# Patient Record
Sex: Male | Born: 1940 | Race: White | Hispanic: No | Marital: Married | Smoking: Former smoker
Health system: Southern US, Community
[De-identification: ages and names within clinical notes are randomized; demographics above are authoritative.]

## PROBLEM LIST (undated history)

## (undated) DIAGNOSIS — J449 Chronic obstructive pulmonary disease, unspecified: Secondary | ICD-10-CM

## (undated) DIAGNOSIS — I6529 Occlusion and stenosis of unspecified carotid artery: Secondary | ICD-10-CM

## (undated) DIAGNOSIS — N390 Urinary tract infection, site not specified: Secondary | ICD-10-CM

## (undated) DIAGNOSIS — IMO0002 Reserved for concepts with insufficient information to code with codable children: Secondary | ICD-10-CM

## (undated) DIAGNOSIS — I4891 Unspecified atrial fibrillation: Secondary | ICD-10-CM

## (undated) DIAGNOSIS — E785 Hyperlipidemia, unspecified: Secondary | ICD-10-CM

## (undated) DIAGNOSIS — N319 Neuromuscular dysfunction of bladder, unspecified: Secondary | ICD-10-CM

## (undated) HISTORY — PX: ORCHIECTOMY: SHX2116

## (undated) HISTORY — DX: Urinary tract infection, site not specified: N39.0

## (undated) HISTORY — DX: Chronic obstructive pulmonary disease, unspecified: J44.9

## (undated) HISTORY — DX: Unspecified atrial fibrillation: I48.91

## (undated) HISTORY — PX: CERVICAL SPINE SURGERY: SHX589

## (undated) HISTORY — PX: ABDOMINAL AORTIC ANEURYSM REPAIR: SUR1152

## (undated) HISTORY — DX: Neuromuscular dysfunction of bladder, unspecified: N31.9

## (undated) HISTORY — PX: SPINAL CORD DECOMPRESSION: SHX97

## (undated) HISTORY — PX: BACK SURGERY: SHX140

## (undated) HISTORY — PX: EYE SURGERY: SHX253

## (undated) HISTORY — DX: Occlusion and stenosis of unspecified carotid artery: I65.29

## (undated) HISTORY — DX: Reserved for concepts with insufficient information to code with codable children: IMO0002

## (undated) HISTORY — PX: CERVICAL DISCECTOMY: SHX98

## (undated) HISTORY — DX: Hyperlipidemia, unspecified: E78.5

---

## 1990-04-16 HISTORY — PX: CARDIAC CATHETERIZATION: SHX172

## 1996-04-16 HISTORY — PX: X-STOP IMPLANTATION: SHX2677

## 1997-08-30 ENCOUNTER — Inpatient Hospital Stay (HOSPITAL_COMMUNITY): Admission: EM | Admit: 1997-08-30 | Discharge: 1997-09-03 | Payer: Self-pay | Admitting: Emergency Medicine

## 1999-05-03 ENCOUNTER — Encounter: Admission: RE | Admit: 1999-05-03 | Discharge: 1999-05-03 | Payer: Self-pay | Admitting: Family Medicine

## 1999-05-03 ENCOUNTER — Encounter: Payer: Self-pay | Admitting: Family Medicine

## 1999-05-25 ENCOUNTER — Observation Stay (HOSPITAL_COMMUNITY): Admission: EM | Admit: 1999-05-25 | Discharge: 1999-05-26 | Payer: Self-pay | Admitting: Emergency Medicine

## 1999-05-25 ENCOUNTER — Encounter: Payer: Self-pay | Admitting: *Deleted

## 2000-07-17 ENCOUNTER — Emergency Department (HOSPITAL_COMMUNITY): Admission: EM | Admit: 2000-07-17 | Discharge: 2000-07-17 | Payer: Self-pay | Admitting: Emergency Medicine

## 2000-07-17 ENCOUNTER — Encounter: Payer: Self-pay | Admitting: Emergency Medicine

## 2001-02-10 ENCOUNTER — Ambulatory Visit (HOSPITAL_COMMUNITY): Admission: RE | Admit: 2001-02-10 | Discharge: 2001-02-10 | Payer: Self-pay | Admitting: Gastroenterology

## 2001-05-15 ENCOUNTER — Encounter: Payer: Self-pay | Admitting: Family Medicine

## 2001-05-15 ENCOUNTER — Encounter: Admission: RE | Admit: 2001-05-15 | Discharge: 2001-05-15 | Payer: Self-pay | Admitting: Family Medicine

## 2001-05-27 ENCOUNTER — Encounter: Payer: Self-pay | Admitting: Family Medicine

## 2001-05-27 ENCOUNTER — Encounter: Admission: RE | Admit: 2001-05-27 | Discharge: 2001-05-27 | Payer: Self-pay | Admitting: Family Medicine

## 2002-10-01 ENCOUNTER — Encounter: Admission: RE | Admit: 2002-10-01 | Discharge: 2002-10-01 | Payer: Self-pay | Admitting: Psychiatry

## 2002-10-01 ENCOUNTER — Encounter: Payer: Self-pay | Admitting: Psychiatry

## 2002-10-12 ENCOUNTER — Inpatient Hospital Stay (HOSPITAL_COMMUNITY): Admission: AD | Admit: 2002-10-12 | Discharge: 2002-11-02 | Payer: Self-pay | Admitting: Neurosurgery

## 2002-10-12 ENCOUNTER — Encounter: Payer: Self-pay | Admitting: Neurosurgery

## 2002-10-13 ENCOUNTER — Encounter: Payer: Self-pay | Admitting: Neurosurgery

## 2002-10-23 ENCOUNTER — Encounter: Payer: Self-pay | Admitting: Cardiovascular Disease

## 2002-10-27 ENCOUNTER — Encounter: Payer: Self-pay | Admitting: Neurosurgery

## 2002-11-02 ENCOUNTER — Inpatient Hospital Stay (HOSPITAL_COMMUNITY)
Admission: RE | Admit: 2002-11-02 | Discharge: 2002-12-02 | Payer: Self-pay | Admitting: Physical Medicine & Rehabilitation

## 2002-11-04 ENCOUNTER — Encounter: Payer: Self-pay | Admitting: Physical Medicine & Rehabilitation

## 2002-11-17 ENCOUNTER — Encounter: Payer: Self-pay | Admitting: Neurosurgery

## 2002-11-19 ENCOUNTER — Encounter: Payer: Self-pay | Admitting: Physical Medicine & Rehabilitation

## 2003-01-12 ENCOUNTER — Encounter
Admission: RE | Admit: 2003-01-12 | Discharge: 2003-04-12 | Payer: Self-pay | Admitting: Physical Medicine & Rehabilitation

## 2003-03-17 ENCOUNTER — Encounter: Admission: RE | Admit: 2003-03-17 | Discharge: 2003-03-17 | Payer: Self-pay | Admitting: Neurosurgery

## 2003-04-27 ENCOUNTER — Inpatient Hospital Stay (HOSPITAL_COMMUNITY): Admission: RE | Admit: 2003-04-27 | Discharge: 2003-04-29 | Payer: Self-pay | Admitting: Neurosurgery

## 2004-03-10 ENCOUNTER — Ambulatory Visit: Payer: Self-pay | Admitting: Family Medicine

## 2004-03-30 ENCOUNTER — Ambulatory Visit: Payer: Self-pay | Admitting: Family Medicine

## 2004-07-18 ENCOUNTER — Ambulatory Visit: Payer: Self-pay | Admitting: Family Medicine

## 2004-07-18 ENCOUNTER — Ambulatory Visit: Payer: Self-pay | Admitting: Cardiology

## 2004-08-08 ENCOUNTER — Ambulatory Visit: Payer: Self-pay

## 2004-09-07 ENCOUNTER — Ambulatory Visit: Payer: Self-pay | Admitting: Cardiology

## 2005-03-21 ENCOUNTER — Ambulatory Visit: Payer: Self-pay | Admitting: Family Medicine

## 2005-05-25 ENCOUNTER — Ambulatory Visit: Payer: Self-pay | Admitting: Family Medicine

## 2005-05-25 LAB — CONVERTED CEMR LAB: PSA: 0.33 ng/mL

## 2005-07-31 ENCOUNTER — Ambulatory Visit: Payer: Self-pay | Admitting: Cardiology

## 2006-06-10 ENCOUNTER — Ambulatory Visit: Payer: Self-pay | Admitting: Family Medicine

## 2006-06-10 LAB — CONVERTED CEMR LAB
ALT: 17 units/L (ref 0–40)
AST: 18 units/L (ref 0–37)
Albumin: 3.4 g/dL — ABNORMAL LOW (ref 3.5–5.2)
BUN: 19 mg/dL (ref 6–23)
Basophils Absolute: 0.1 10*3/uL (ref 0.0–0.1)
Basophils Relative: 0.9 % (ref 0.0–1.0)
CO2: 26 meq/L (ref 19–32)
Calcium: 9.2 mg/dL (ref 8.4–10.5)
Chloride: 105 meq/L (ref 96–112)
Cholesterol: 117 mg/dL (ref 0–200)
Creatinine, Ser: 0.6 mg/dL (ref 0.4–1.5)
Eosinophils Absolute: 0.1 10*3/uL (ref 0.0–0.6)
Eosinophils Relative: 2.2 % (ref 0.0–5.0)
GFR calc Af Amer: 173 mL/min
GFR calc non Af Amer: 143 mL/min
Glucose, Bld: 175 mg/dL — ABNORMAL HIGH (ref 70–99)
HCT: 40.4 % (ref 39.0–52.0)
HDL: 36 mg/dL — ABNORMAL LOW (ref >39.0)
Hemoglobin: 14.2 g/dL (ref 13.0–17.0)
Hgb A1c MFr Bld: 7.5 % — ABNORMAL HIGH (ref 4.6–6.0)
LDL Cholesterol: 49 mg/dL (ref 0–99)
Lymphocytes Relative: 24.9 % (ref 12.0–46.0)
MCHC: 35.1 g/dL (ref 30.0–36.0)
MCV: 96.8 fL (ref 78.0–100.0)
Monocytes Absolute: 0.4 10*3/uL (ref 0.2–0.7)
Monocytes Relative: 6.9 % (ref 3.0–11.0)
Neutro Abs: 3.9 10*3/uL (ref 1.4–7.7)
Neutrophils Relative %: 65.1 % (ref 43.0–77.0)
Phosphorus: 3 mg/dL (ref 2.3–4.6)
Platelets: 288 10*3/uL (ref 150–400)
Potassium: 4.4 meq/L (ref 3.5–5.1)
RBC: 4.17 M/uL — ABNORMAL LOW (ref 4.22–5.81)
RDW: 12 % (ref 11.5–14.6)
Sodium: 140 meq/L (ref 135–145)
Total CHOL/HDL Ratio: 3.3
Triglycerides: 158 mg/dL — ABNORMAL HIGH (ref 0–149)
VLDL: 32 mg/dL (ref 0–40)
WBC: 6 10*3/uL (ref 4.5–10.5)

## 2006-09-17 ENCOUNTER — Ambulatory Visit: Payer: Self-pay | Admitting: Cardiology

## 2006-11-28 ENCOUNTER — Encounter: Payer: Self-pay | Admitting: Family Medicine

## 2006-11-28 DIAGNOSIS — J449 Chronic obstructive pulmonary disease, unspecified: Secondary | ICD-10-CM

## 2006-11-28 DIAGNOSIS — R809 Proteinuria, unspecified: Secondary | ICD-10-CM | POA: Insufficient documentation

## 2006-11-28 DIAGNOSIS — IMO0002 Reserved for concepts with insufficient information to code with codable children: Secondary | ICD-10-CM | POA: Insufficient documentation

## 2006-11-28 DIAGNOSIS — R339 Retention of urine, unspecified: Secondary | ICD-10-CM | POA: Insufficient documentation

## 2006-11-28 DIAGNOSIS — E785 Hyperlipidemia, unspecified: Secondary | ICD-10-CM

## 2006-11-28 DIAGNOSIS — E119 Type 2 diabetes mellitus without complications: Secondary | ICD-10-CM

## 2006-11-28 DIAGNOSIS — J4489 Other specified chronic obstructive pulmonary disease: Secondary | ICD-10-CM | POA: Insufficient documentation

## 2006-11-28 DIAGNOSIS — Z87891 Personal history of nicotine dependence: Secondary | ICD-10-CM | POA: Insufficient documentation

## 2006-11-28 DIAGNOSIS — I4891 Unspecified atrial fibrillation: Secondary | ICD-10-CM | POA: Insufficient documentation

## 2006-11-29 ENCOUNTER — Ambulatory Visit: Payer: Self-pay | Admitting: Family Medicine

## 2006-12-04 ENCOUNTER — Ambulatory Visit: Payer: Self-pay | Admitting: Ophthalmology

## 2006-12-25 ENCOUNTER — Telehealth (INDEPENDENT_AMBULATORY_CARE_PROVIDER_SITE_OTHER): Payer: Self-pay | Admitting: *Deleted

## 2006-12-25 ENCOUNTER — Ambulatory Visit: Payer: Self-pay | Admitting: Family Medicine

## 2006-12-26 LAB — CONVERTED CEMR LAB
ALT: 14 units/L (ref 0–53)
AST: 14 units/L (ref 0–37)
Albumin: 3.4 g/dL — ABNORMAL LOW (ref 3.5–5.2)
BUN: 11 mg/dL (ref 6–23)
CO2: 29 meq/L (ref 19–32)
Calcium: 9.1 mg/dL (ref 8.4–10.5)
Chloride: 102 meq/L (ref 96–112)
Cholesterol: 102 mg/dL (ref 0–200)
Creatinine, Ser: 0.6 mg/dL (ref 0.4–1.5)
GFR calc Af Amer: 173 mL/min
GFR calc non Af Amer: 143 mL/min
Glucose, Bld: 125 mg/dL — ABNORMAL HIGH (ref 70–99)
HDL: 31.4 mg/dL — ABNORMAL LOW (ref >39.0)
Hgb A1c MFr Bld: 6.7 % — ABNORMAL HIGH (ref 4.6–6.0)
LDL Cholesterol: 50 mg/dL (ref 0–99)
Phosphorus: 3 mg/dL (ref 2.3–4.6)
Potassium: 4.8 meq/L (ref 3.5–5.1)
Sodium: 137 meq/L (ref 135–145)
Total CHOL/HDL Ratio: 3.2
Triglycerides: 102 mg/dL (ref 0–149)
VLDL: 20 mg/dL (ref 0–40)

## 2007-04-25 ENCOUNTER — Encounter: Payer: Self-pay | Admitting: Family Medicine

## 2007-05-01 ENCOUNTER — Telehealth: Payer: Self-pay | Admitting: Family Medicine

## 2007-07-08 ENCOUNTER — Telehealth: Payer: Self-pay | Admitting: Family Medicine

## 2007-10-20 ENCOUNTER — Encounter: Payer: Self-pay | Admitting: Family Medicine

## 2007-10-22 ENCOUNTER — Ambulatory Visit: Payer: Self-pay

## 2007-10-22 ENCOUNTER — Ambulatory Visit: Payer: Self-pay | Admitting: Family Medicine

## 2007-10-22 ENCOUNTER — Ambulatory Visit: Payer: Self-pay | Admitting: Cardiology

## 2007-12-29 ENCOUNTER — Ambulatory Visit: Payer: Self-pay | Admitting: Family Medicine

## 2008-02-09 ENCOUNTER — Ambulatory Visit: Payer: Self-pay | Admitting: Family Medicine

## 2008-02-10 LAB — CONVERTED CEMR LAB
Albumin: 3.7 g/dL (ref 3.5–5.2)
BUN: 23 mg/dL (ref 6–23)
CO2: 26 meq/L (ref 19–32)
Calcium: 8.8 mg/dL (ref 8.4–10.5)
Chloride: 108 meq/L (ref 96–112)
Creatinine, Ser: 0.8 mg/dL (ref 0.4–1.5)
GFR calc Af Amer: 124 mL/min
Glucose, Bld: 146 mg/dL — ABNORMAL HIGH (ref 70–99)
Hemoglobin: 12.7 g/dL — ABNORMAL LOW (ref 13.0–17.0)
Hgb A1c MFr Bld: 6.9 % — ABNORMAL HIGH (ref 4.6–6.0)
Phosphorus: 2.6 mg/dL (ref 2.3–4.6)
Potassium: 4.3 meq/L (ref 3.5–5.1)

## 2008-04-21 ENCOUNTER — Encounter: Payer: Self-pay | Admitting: Family Medicine

## 2008-06-28 ENCOUNTER — Ambulatory Visit: Payer: Self-pay | Admitting: Family Medicine

## 2008-06-29 LAB — CONVERTED CEMR LAB
AST: 16 units/L (ref 0–37)
Albumin: 3.9 g/dL (ref 3.5–5.2)
Alkaline Phosphatase: 52 units/L (ref 39–117)
Bilirubin, Direct: 0.1 mg/dL (ref 0.0–0.3)
CO2: 28 meq/L (ref 19–32)
Chloride: 105 meq/L (ref 96–112)
GFR calc Af Amer: 96 mL/min
GFR calc non Af Amer: 79 mL/min
Glucose, Bld: 155 mg/dL — ABNORMAL HIGH (ref 70–99)
HDL: 37.7 mg/dL — ABNORMAL LOW (ref >39.0)
Hgb A1c MFr Bld: 7.6 % — ABNORMAL HIGH (ref 4.6–6.0)
LDL Cholesterol: 60 mg/dL (ref 0–99)
Phosphorus: 3 mg/dL (ref 2.3–4.6)
Potassium: 4.6 meq/L (ref 3.5–5.1)
Sodium: 141 meq/L (ref 135–145)
Total Bilirubin: 0.9 mg/dL (ref 0.3–1.2)
Total CHOL/HDL Ratio: 3.5
Total Protein: 6.6 g/dL (ref 6.0–8.3)
VLDL: 33 mg/dL (ref 0–40)

## 2008-09-29 ENCOUNTER — Telehealth (INDEPENDENT_AMBULATORY_CARE_PROVIDER_SITE_OTHER): Payer: Self-pay | Admitting: *Deleted

## 2008-10-05 ENCOUNTER — Ambulatory Visit: Payer: Self-pay | Admitting: Family Medicine

## 2008-10-07 LAB — CONVERTED CEMR LAB
Albumin: 3.7 g/dL (ref 3.5–5.2)
CO2: 27 meq/L (ref 19–32)
Calcium: 9.2 mg/dL (ref 8.4–10.5)
Chloride: 104 meq/L (ref 96–112)
Creatinine, Ser: 0.8 mg/dL (ref 0.4–1.5)
Hgb A1c MFr Bld: 7.7 % — ABNORMAL HIGH (ref 4.6–6.5)
Phosphorus: 3.4 mg/dL (ref 2.3–4.6)
Potassium: 4.4 meq/L (ref 3.5–5.1)
Sodium: 140 meq/L (ref 135–145)

## 2008-11-05 ENCOUNTER — Encounter: Payer: Self-pay | Admitting: Family Medicine

## 2008-11-30 ENCOUNTER — Ambulatory Visit: Payer: Self-pay

## 2008-11-30 ENCOUNTER — Encounter: Payer: Self-pay | Admitting: Cardiology

## 2008-11-30 DIAGNOSIS — I6529 Occlusion and stenosis of unspecified carotid artery: Secondary | ICD-10-CM

## 2008-12-14 ENCOUNTER — Ambulatory Visit: Payer: Self-pay | Admitting: Cardiology

## 2008-12-14 DIAGNOSIS — I452 Bifascicular block: Secondary | ICD-10-CM | POA: Insufficient documentation

## 2008-12-14 DIAGNOSIS — I4892 Unspecified atrial flutter: Secondary | ICD-10-CM | POA: Insufficient documentation

## 2008-12-30 ENCOUNTER — Ambulatory Visit: Payer: Self-pay | Admitting: Family Medicine

## 2009-03-28 ENCOUNTER — Telehealth: Payer: Self-pay | Admitting: Family Medicine

## 2009-04-06 ENCOUNTER — Ambulatory Visit: Payer: Self-pay | Admitting: Family Medicine

## 2009-04-09 LAB — CONVERTED CEMR LAB
Creatinine,U: 40.2 mg/dL
Microalb Creat Ratio: 5 mg/g (ref 0.0–30.0)
Microalb, Ur: 0.2 mg/dL (ref 0.0–1.9)

## 2009-04-11 LAB — CONVERTED CEMR LAB
ALT: 17 units/L (ref 0–53)
AST: 12 units/L (ref 0–37)
Albumin: 3.7 g/dL (ref 3.5–5.2)
BUN: 29 mg/dL — ABNORMAL HIGH (ref 6–23)
CO2: 26 meq/L (ref 19–32)
Calcium: 9.1 mg/dL (ref 8.4–10.5)
Chloride: 103 meq/L (ref 96–112)
Cholesterol: 125 mg/dL (ref 0–200)
Creatinine, Ser: 1 mg/dL (ref 0.4–1.5)
GFR calc non Af Amer: 78.64 mL/min (ref >60)
Glucose, Bld: 188 mg/dL — ABNORMAL HIGH (ref 70–99)
HDL: 37.8 mg/dL — ABNORMAL LOW (ref >39.00)
LDL Cholesterol: 51 mg/dL (ref 0–99)
Potassium: 4.5 meq/L (ref 3.5–5.1)
Sodium: 136 meq/L (ref 135–145)
Total CHOL/HDL Ratio: 3
VLDL: 35.8 mg/dL (ref 0.0–40.0)

## 2009-04-25 ENCOUNTER — Ambulatory Visit: Payer: Self-pay | Admitting: Family Medicine

## 2009-07-08 ENCOUNTER — Encounter: Payer: Self-pay | Admitting: Family Medicine

## 2009-07-18 ENCOUNTER — Ambulatory Visit: Payer: Self-pay | Admitting: Family Medicine

## 2009-07-18 LAB — CONVERTED CEMR LAB
Albumin: 4 g/dL (ref 3.5–5.2)
BUN: 22 mg/dL (ref 6–23)
CO2: 27 meq/L (ref 19–32)
Calcium: 9.3 mg/dL (ref 8.4–10.5)
Chloride: 102 meq/L (ref 96–112)
Creatinine, Ser: 0.9 mg/dL (ref 0.4–1.5)
GFR calc non Af Amer: 88.74 mL/min (ref >60)
Glucose, Bld: 222 mg/dL — ABNORMAL HIGH (ref 70–99)
Hgb A1c MFr Bld: 8 % — ABNORMAL HIGH (ref 4.6–6.5)
Phosphorus: 3 mg/dL (ref 2.3–4.6)
Potassium: 4.2 meq/L (ref 3.5–5.1)
Sodium: 139 meq/L (ref 135–145)

## 2009-07-26 ENCOUNTER — Ambulatory Visit: Payer: Self-pay | Admitting: Family Medicine

## 2009-07-26 DIAGNOSIS — N390 Urinary tract infection, site not specified: Secondary | ICD-10-CM | POA: Insufficient documentation

## 2009-08-11 ENCOUNTER — Encounter: Payer: Self-pay | Admitting: Family Medicine

## 2009-08-16 ENCOUNTER — Encounter: Payer: Self-pay | Admitting: Family Medicine

## 2009-08-16 ENCOUNTER — Telehealth: Payer: Self-pay | Admitting: Family Medicine

## 2009-08-18 ENCOUNTER — Telehealth: Payer: Self-pay | Admitting: Family Medicine

## 2009-09-02 ENCOUNTER — Encounter: Payer: Self-pay | Admitting: Family Medicine

## 2009-09-09 ENCOUNTER — Encounter: Payer: Self-pay | Admitting: Family Medicine

## 2009-10-18 ENCOUNTER — Ambulatory Visit: Payer: Self-pay | Admitting: Family Medicine

## 2009-10-18 LAB — CONVERTED CEMR LAB
Albumin: 3.8 g/dL (ref 3.5–5.2)
BUN: 23 mg/dL (ref 6–23)
CO2: 28 meq/L (ref 19–32)
Calcium: 8.9 mg/dL (ref 8.4–10.5)
Chloride: 108 meq/L (ref 96–112)
Creatinine, Ser: 0.8 mg/dL (ref 0.4–1.5)
GFR calc non Af Amer: 97.36 mL/min (ref >60)
Glucose, Bld: 80 mg/dL (ref 70–99)
Hgb A1c MFr Bld: 7.3 % — ABNORMAL HIGH (ref 4.6–6.5)
Phosphorus: 3.4 mg/dL (ref 2.3–4.6)
Potassium: 4.7 meq/L (ref 3.5–5.1)
Sodium: 142 meq/L (ref 135–145)

## 2009-10-25 ENCOUNTER — Ambulatory Visit: Payer: Self-pay | Admitting: Family Medicine

## 2009-12-23 ENCOUNTER — Telehealth: Payer: Self-pay | Admitting: Family Medicine

## 2009-12-25 ENCOUNTER — Encounter: Payer: Self-pay | Admitting: Internal Medicine

## 2009-12-25 ENCOUNTER — Inpatient Hospital Stay: Payer: BC Managed Care – PPO | Admitting: Internal Medicine

## 2009-12-25 ENCOUNTER — Ambulatory Visit: Payer: Self-pay | Admitting: Cardiovascular Disease

## 2009-12-26 ENCOUNTER — Encounter: Payer: Self-pay | Admitting: Internal Medicine

## 2009-12-27 ENCOUNTER — Encounter: Payer: Self-pay | Admitting: Internal Medicine

## 2009-12-28 ENCOUNTER — Encounter: Payer: Self-pay | Admitting: Family Medicine

## 2009-12-28 ENCOUNTER — Encounter: Payer: Self-pay | Admitting: Internal Medicine

## 2010-01-09 ENCOUNTER — Ambulatory Visit: Payer: Self-pay | Admitting: Internal Medicine

## 2010-01-13 ENCOUNTER — Ambulatory Visit: Payer: Self-pay | Admitting: Internal Medicine

## 2010-01-13 DIAGNOSIS — I5022 Chronic systolic (congestive) heart failure: Secondary | ICD-10-CM

## 2010-01-16 ENCOUNTER — Telehealth (INDEPENDENT_AMBULATORY_CARE_PROVIDER_SITE_OTHER): Payer: Self-pay | Admitting: *Deleted

## 2010-01-23 ENCOUNTER — Ambulatory Visit: Payer: Self-pay | Admitting: Family Medicine

## 2010-01-23 LAB — CONVERTED CEMR LAB
ALT: 13 units/L (ref 0–53)
AST: 12 units/L (ref 0–37)
Albumin: 3.4 g/dL — ABNORMAL LOW (ref 3.5–5.2)
BUN: 16 mg/dL (ref 6–23)
CO2: 24 meq/L (ref 19–32)
Calcium: 8.5 mg/dL (ref 8.4–10.5)
Chloride: 108 meq/L (ref 96–112)
Cholesterol: 108 mg/dL (ref 0–200)
Creatinine, Ser: 0.9 mg/dL (ref 0.4–1.5)
GFR calc non Af Amer: 84.27 mL/min (ref >60)
Glucose, Bld: 170 mg/dL — ABNORMAL HIGH (ref 70–99)
HDL: 30.2 mg/dL — ABNORMAL LOW (ref >39.00)
Hgb A1c MFr Bld: 7.7 % — ABNORMAL HIGH (ref 4.6–6.5)
LDL Cholesterol: 40 mg/dL (ref 0–99)
Phosphorus: 2.8 mg/dL (ref 2.3–4.6)
Potassium: 4 meq/L (ref 3.5–5.1)
Sodium: 140 meq/L (ref 135–145)
Total CHOL/HDL Ratio: 4
Triglycerides: 191 mg/dL — ABNORMAL HIGH (ref 0.0–149.0)
VLDL: 38.2 mg/dL (ref 0.0–40.0)

## 2010-01-26 ENCOUNTER — Ambulatory Visit: Payer: Self-pay | Admitting: Family Medicine

## 2010-01-27 LAB — CONVERTED CEMR LAB
Creatinine,U: 32.2 mg/dL
Microalb Creat Ratio: 1.9 mg/g (ref 0.0–30.0)

## 2010-02-22 ENCOUNTER — Telehealth: Payer: Self-pay | Admitting: Family Medicine

## 2010-03-01 ENCOUNTER — Telehealth: Payer: Self-pay | Admitting: Family Medicine

## 2010-03-17 ENCOUNTER — Ambulatory Visit: Payer: Self-pay | Admitting: Internal Medicine

## 2010-04-26 ENCOUNTER — Other Ambulatory Visit: Payer: Self-pay | Admitting: Family Medicine

## 2010-04-26 ENCOUNTER — Ambulatory Visit
Admission: RE | Admit: 2010-04-26 | Discharge: 2010-04-26 | Payer: Self-pay | Source: Home / Self Care | Attending: Family Medicine | Admitting: Family Medicine

## 2010-04-26 ENCOUNTER — Telehealth (INDEPENDENT_AMBULATORY_CARE_PROVIDER_SITE_OTHER): Payer: Self-pay | Admitting: *Deleted

## 2010-04-26 LAB — RENAL FUNCTION PANEL
Albumin: 3.6 g/dL (ref 3.5–5.2)
BUN: 25 mg/dL — ABNORMAL HIGH (ref 6–23)
CO2: 25 mEq/L (ref 19–32)
Calcium: 8.9 mg/dL (ref 8.4–10.5)
Chloride: 107 mEq/L (ref 96–112)
Creatinine, Ser: 1 mg/dL (ref 0.4–1.5)
GFR: 81.21 mL/min (ref >60.00)
Glucose, Bld: 88 mg/dL (ref 70–99)
Phosphorus: 2.9 mg/dL (ref 2.3–4.6)
Potassium: 4.6 mEq/L (ref 3.5–5.1)
Sodium: 141 mEq/L (ref 135–145)

## 2010-04-26 LAB — HEMOGLOBIN A1C: Hgb A1c MFr Bld: 7.4 % — ABNORMAL HIGH (ref 4.6–6.5)

## 2010-05-01 ENCOUNTER — Ambulatory Visit
Admission: RE | Admit: 2010-05-01 | Discharge: 2010-05-01 | Payer: Self-pay | Source: Home / Self Care | Attending: Family Medicine | Admitting: Family Medicine

## 2010-05-14 LAB — CONVERTED CEMR LAB
ALT: 12 units/L (ref 0–53)
AST: 12 units/L (ref 0–37)
Albumin: 3.6 g/dL (ref 3.5–5.2)
Alkaline Phosphatase: 61 units/L (ref 39–117)
BUN: 21 mg/dL (ref 6–23)
Basophils Absolute: 0.1 10*3/uL (ref 0.0–0.1)
Basophils Relative: 1.3 % — ABNORMAL HIGH (ref 0.0–1.0)
Bilirubin, Direct: 0.1 mg/dL (ref 0.0–0.3)
CO2: 25 meq/L (ref 19–32)
Calcium: 8.9 mg/dL (ref 8.4–10.5)
Chloride: 105 meq/L (ref 96–112)
Cholesterol: 125 mg/dL (ref 0–200)
Creatinine, Ser: 0.8 mg/dL (ref 0.4–1.5)
Eosinophils Absolute: 0.1 10*3/uL (ref 0.0–0.7)
Eosinophils Relative: 2.7 % (ref 0.0–5.0)
GFR calc Af Amer: 124 mL/min
GFR calc non Af Amer: 102 mL/min
Glucose, Bld: 109 mg/dL — ABNORMAL HIGH (ref 70–99)
HCT: 35.9 % — ABNORMAL LOW (ref 39.0–52.0)
HDL: 40.6 mg/dL (ref >39.0)
Hemoglobin: 12.3 g/dL — ABNORMAL LOW (ref 13.0–17.0)
Hgb A1c MFr Bld: 7.9 % — ABNORMAL HIGH (ref 4.6–6.0)
LDL Cholesterol: 60 mg/dL (ref 0–99)
Lymphocytes Relative: 20.2 % (ref 12.0–46.0)
MCHC: 34.2 g/dL (ref 30.0–36.0)
MCV: 97 fL (ref 78.0–100.0)
Monocytes Absolute: 0.4 10*3/uL (ref 0.1–1.0)
Monocytes Relative: 8.4 % (ref 3.0–12.0)
Neutro Abs: 3.2 10*3/uL (ref 1.4–7.7)
Neutrophils Relative %: 67.4 % (ref 43.0–77.0)
Phosphorus: 3 mg/dL (ref 2.3–4.6)
Platelets: 261 10*3/uL (ref 150–400)
Potassium: 4.3 meq/L (ref 3.5–5.1)
RBC: 3.71 M/uL — ABNORMAL LOW (ref 4.22–5.81)
RDW: 12.6 % (ref 11.5–14.6)
Sodium: 138 meq/L (ref 135–145)
Total Bilirubin: 0.8 mg/dL (ref 0.3–1.2)
Total CHOL/HDL Ratio: 3.1
Total Protein: 6.5 g/dL (ref 6.0–8.3)
Triglycerides: 124 mg/dL (ref 0–149)
VLDL: 25 mg/dL (ref 0–40)
WBC: 4.7 10*3/uL (ref 4.5–10.5)

## 2010-05-18 NOTE — Medication Information (Signed)
Summary: [facility] Med List  [facility]   Imported By: Harlon Flor 01/02/2010 09:02:31  _____________________________________________________________________  External Attachment:    Type:   Image     Comment:   External Document

## 2010-05-18 NOTE — Letter (Signed)
[facility]   [facility]   Imported By: Roderic Ovens 01/25/2010 10:37:17  _____________________________________________________________________  External Attachment:    Type:   Image     Comment:   External Document

## 2010-05-18 NOTE — Letter (Signed)
Summary: [facility]  [facility]   Imported By: Lanelle Bal 09/20/2009 10:24:33  _____________________________________________________________________  External Attachment:    Type:   Image     Comment:   External Document

## 2010-05-18 NOTE — Assessment & Plan Note (Signed)
Summary: 3 MONTH FOLLOW UP/[facility]   Vital Signs:  Patient profile:   70 year old male Temp:     98.1 degrees F oral Pulse rate:   60 / minute Pulse rhythm:   regular BP sitting:   108 / 60  (left arm) Cuff size:   large  Vitals Entered By: Selena Batten Dance CMA Duncan Dull) (May 01, 2010 4:10 PM) CC: 3 month follow up Comments Unable to stand to weigh   History of Present Illness: here to f/u for DM2  is feeling "not bad" overall   ate a lot for thanksgiving and christmas for a couple of days   has some congestion in sinuses last 10 days ago  thinks is just a cold - yellow drainage , no fever , no facial pain  ears stopped up  breating is good    AIC is 7.4 down from 7.7 on 35 u of lantus currently with his oral med (glipizide and metformin)   sugars   am are good -- ranges from 80s to 130s for the most part  a good number of sugars in eve are in 180s or above (though some below )   in am eats pc of bread and banana  then does not eat much until 7 pm  in between eats a cracker or 2   evening meal is eating chicken / seafood  tues has popcorn shrimp   some bedtime snacks -- if sugar is lower   some upper body exercise now  uses a gallon jug with cord over doorframe -- pulls on this 30 min per day  this does really get his heart rate up     Allergies: 1)  ! Quinidine 2)  ! * Spiriva  Past History:  Past Medical History: Last updated: 06/28/2008 Atrial fibrillation COPD Diabetes mellitus, type II Hyperlipidemia spinal cord injury- wheelchair bound neurogenic bladder (occ infections) prior smoker- quit 2008 carotid stenosis chronic/freq utis  urol cardiol- Wall  Past Surgical History: Last updated: 12/30/2008 Cataract extraction Orchiectomy- left, secondary to torsion Spinal stenosis- decompression (1998) Cervical spine bone graft Cath (1992) EKG (1993) CXR (1997) LE arterial study (12/1995) Cervical diskectomy (1998, 2004) Cervical cord injury-  C4 Colonoscopy (01/2001) Lumbar spinal stenosis- surgery (04/2003) Abd [place]- normal (06/1995) 2D Echo (07/2004) EF 50-55% Cardiolite- ? mild ischemia (07/2004) carotid doppler (7/09) R 60-79% stenosis, and L 0-39% carotid doppler (8/10) R 60-79% adn L 0-39% without change   Family History: Last updated: 11/28/2006 Father: killed in [place] Mother: deceased alzheimer's, stroke Siblings: brother- bypass surgery x 4  Social History: Last updated: 10/22/2007 Marital Status: separated (? divorced) Children: 3 Occupation: disabled long time smoker- quit 2008 no alcohol  Risk Factors: Smoking Status: current (11/28/2006)  Review of Systems General:  Complains of fatigue; denies chills, fever, and loss of appetite. Eyes:  Denies blurring and eye irritation. ENT:  Complains of nasal congestion and postnasal drainage; denies earache and sore throat. CV:  Complains of swelling of feet; denies chest pain or discomfort and palpitations. Resp:  Denies cough and wheezing. GI:  Denies abdominal pain, change in bowel habits, and nausea. GU:  utis have really improved . MS:  Denies muscle aches and cramps. Derm:  Denies itching, lesion(s), poor wound healing, and rash. Neuro:  Complains of numbness and tingling; denies headaches and visual disturbances. Psych:  does not think he is depressed. Endo:  Denies cold intolerance, excessive thirst, excessive urination, and heat intolerance. Heme:  Denies abnormal bruising and bleeding.  Physical Exam  General:  wheelchair bound - generally well appearing Head:  normocephalic, atraumatic, and no abnormalities observed.  no sinus tenderness Eyes:  vision grossly intact, pupils equal, pupils round, and pupils reactive to light.  no conjunctival pallor, injection or icterus  Ears:  R ear normal and L ear normal.  -some cerumen  Nose:  nares are injected and congested bilaterally  Mouth:  pharynx pink and moist.   Neck:  supple with full rom and no  masses or thyromegally, no JVD or carotid bruit  Chest Wall:  No deformities, masses, tenderness or gynecomastia noted. Lungs:  diffusely distant bs without rales or crackles no wheeze today- good air exch Heart:  RRR today no M or gallop Msk:  wheelchair bound from prior back injury Pulses:  plus one pedal pulses Extremities:  trace left pedal edema and trace right pedal edema. ankles with rubor that is baseline   Neurologic:  baseline neurologic changes in legs- sensory and motor   Skin:  Intact without suspicious lesions or rashes Cervical Nodes:  No lymphadenopathy noted Psych:  normal affect, talkative and pleasant   Diabetes Management Exam:    Foot Exam (with socks and/or shoes not present):       Sensory-Pinprick/Light touch:          Left medial foot (L-4): normal          Left dorsal foot (L-5): normal          Left lateral foot (S-1): normal          Right medial foot (L-4): normal          Right dorsal foot (L-5): normal          Right lateral foot (S-1): normal       Sensory-Monofilament:          Left foot: normal          Right foot: normal       Inspection:          Left foot: normal          Right foot: normal       Nails:          Left foot: normal          Right foot: normal   Impression & Recommendations:  Problem # 1:  DIABETES MELLITUS, TYPE II (ICD-250.00) Assessment Deteriorated  very labile sugars -- reviewed these in detail with pt  also reviewed diet which is suboptimal  skipping meals is risky- we disk this  also not getting protien pt claims he has good understanding of DM diet -- urged him to review his diabetic teaching materials (even though  he hates to read) lack of motivation is sensed  will not change med at this time- but work on lifestyle change and review again in 3 mo  His updated medication list for this problem includes:    Glipizide 10 Mg Tb24 (Glipizide) .Marland Kitchen... Take 1 tablet by mouth once a day    Metformin Hcl 500 Mg Tabs  (Metformin hcl) .Marland Kitchen... Take 1 1/2 tablets by mouth two times a day    Lantus 100 Unit/ml Soln (Insulin glargine) ..... Inject 35 units as directed each evening  Labs Reviewed: Creat: 1.0 (04/26/2010)     Last Eye Exam: normal (07/08/2009) Reviewed HgBA1c results: 7.4 (04/26/2010)  7.7 (01/23/2010)  Problem # 2:  VIRAL URI (ICD-465.9) Assessment: New recommend sympt care- see pt instructions  -- mucinex/ antihistamine/ fluids/ saline  spray prn pt advised to update me if symptoms worsen or do not improve   Complete Medication List: 1)  Furosemide 40 Mg Tabs (Furosemide) .... Take 1 tablet by mouth once daily as needed 2)  Gabapentin 800 Mg Tabs (Gabapentin) .... Take 1/2  tablet by four times daily 3)  Glipizide 10 Mg Tb24 (Glipizide) .... Take 1 tablet by mouth once a day 4)  Metformin Hcl 500 Mg Tabs (Metformin hcl) .... Take 1 1/2 tablets by mouth two times a day 5)  Avodart 0.5 Mg Caps (Dutasteride) .... One by mouth twice a week 6)  Flomax 0.4 Mg Cp24 (Tamsulosin hcl) .... One by mouth daily 7)  Nitrofurantoin Monohyd Macro 100 Mg Caps (Nitrofurantoin monohyd macro) .Marland Kitchen.. 1 daily by mouth 8)  Lantus 100 Unit/ml Soln (Insulin glargine) .... Inject 35 units as directed each evening 9)  Insulin Syringes and Needles  .... To inject lantus insulin once daily as directed for dm2 250.0 10)  Pradaxa 150 Mg Caps (Dabigatran etexilate mesylate) .... Take 1 tablet by mouth two times a day 11)  Amiodarone Hcl 200 Mg Tabs (Amiodarone hcl) .... Take one tablet by mouth once daily 12)  Pravachol 20 Mg Tabs (Pravastatin sodium) .Marland Kitchen.. 1 by mouth once daily  Patient Instructions: 1)  get those diabetic materials out and re read them  2)  start eating 3 meals per day -- each with protein  3)  more green vegetables  4)  dont' wait all day to eat  5)  no change in medicine - but if with diet change you get a lot of low sugars let me know  6)  optimally sugar should be 120 fasting (or below ) and 140 2  hours after a meal (or below)- but not under 70 as a rule  7)  elevate feet when you can  8)  have someone help put support stockings on in the am 9)  have someone help with meals the best you can 10)  keep up the exercise -- and increase if you feel up to it  11)  schedule  12)  schedule fasting lab in 3 months and then follow up [facility] / renal and lipid/ast/alt  272 , 250.0    Orders Added: 1)  Est. Patient Level IV [57846]    Current Allergies (reviewed today): ! QUINIDINE ! * SPIRIVA

## 2010-05-18 NOTE — Assessment & Plan Note (Signed)
Summary: 2 week follow up with blood sugar   Vital Signs:  Patient profile:   70 year old male Temp:     97.6 degrees F oral Pulse rate:   76 / minute Pulse rhythm:   regular BP sitting:   96 / 50  (left arm) Cuff size:   regular  Vitals Entered By: Lowella Petties CMA (April 25, 2009 8:56 AM) CC: 2 week follow up with blood sugars   History of Present Illness: here for f/u of DM that has worsened  last labs AIC up from 7.7 to 7.9- gradually on the rise  januvia has not helped at all- in fact sugar went up instead of down    [place] was added last time cannot inc metforumin due to diarrhea is on glucotrol   sugars are all over the place  avg sugar in am problably in the 170s (sometimes higher or lower)  sugar in afternoon is usually above 160-- sometimes over 200  one isolated sugar at 99 when he had not eaten   is trying to eat lighter later in the day is sticking fairly close to diabetic diet ? - knows what to eat and actually that is not true is eating too much cereal  has been to DM classes   breakfast is cereal -- bowl of sweetened  for lunch - cheese sandwhich/ pork and beans / mac and cheese  does eat 2 pc of bread with sandwhich -- and knows he should not  dinner -- fast food / pizza / sub sandwhich   thinks he can really change his diet   has not had to have insulin yet       Allergies: 1)  ! Quinidine 2)  ! * Spiriva  Past History:  Past Medical History: Last updated: 06/28/2008 Atrial fibrillation COPD Diabetes mellitus, type II Hyperlipidemia spinal cord injury- wheelchair bound neurogenic bladder (occ infections) prior smoker- quit 2008 carotid stenosis chronic/freq utis  urol cardiol- Wall  Past Surgical History: Last updated: 12/30/2008 Cataract extraction Orchiectomy- left, secondary to torsion Spinal stenosis- decompression (1998) Cervical spine bone graft Cath (1992) EKG (1993) CXR (1997) LE arterial study  (12/1995) Cervical diskectomy (1998, 2004) Cervical cord injury- C4 Colonoscopy (01/2001) Lumbar spinal stenosis- surgery (04/2003) Abd [place]- normal (06/1995) 2D Echo (07/2004) EF 50-55% Cardiolite- ? mild ischemia (07/2004) carotid doppler (7/09) R 60-79% stenosis, and L 0-39% carotid doppler (8/10) R 60-79% adn L 0-39% without change   Family History: Last updated: 11/28/2006 Father: killed in [place] Mother: deceased alzheimer's, stroke Siblings: brother- bypass surgery x 4  Social History: Last updated: 10/22/2007 Marital Status: separated (? divorced) Children: 3 Occupation: disabled long time smoker- quit 2008 no alcohol  Risk Factors: Smoking Status: current (11/28/2006)  Review of Systems General:  Denies fatigue, fever, loss of appetite, and malaise. Eyes:  Denies blurring and eye irritation. CV:  Denies chest pain or discomfort, lightheadness, palpitations, and shortness of breath with exertion. Resp:  Denies cough and wheezing. GI:  Denies abdominal pain, change in bowel habits, and indigestion. MS:  Denies muscle aches and cramps. Derm:  Denies itching, poor wound healing, and rash. Neuro:  Complains of numbness and tingling. Psych:  mood is generally ok . Endo:  Denies cold intolerance, excessive thirst, excessive urination, and heat intolerance. Heme:  Denies abnormal bruising and bleeding.  Physical Exam  General:  wheelchair bound- appears well Head:  normocephalic, atraumatic, and no abnormalities observed.   Eyes:  vision grossly intact, pupils equal,  pupils round, and pupils reactive to light.   Mouth:  pharynx pink and moist.   Neck:  supple with full rom and no masses or thyromegally, no JVD or carotid bruit  Lungs:  diffusely distant bs without rales or crackles no wheeze today- good air exch Heart:  irregularly irregular rhythm  no M or gallop Extremities:  trace left pedal edema and trace right pedal edema. ankles with rubor that is baseline    Neurologic:  sensation intact to light touch, gait normal, and DTRs symmetrical and normal.   Skin:  Intact without suspicious lesions or rashes Cervical Nodes:  No lymphadenopathy noted Psych:  normal affect, talkative and pleasant    Impression & Recommendations:  Problem # 1:  DIABETES MELLITUS, TYPE II (ICD-250.00) Assessment Deteriorated worse control - on review diet is terrible disc this in detail - pt states he knows how to prep DM diet and will start doing it he refuses any addn med or insulin at all at this time- and wants to try better diet for 3 mo  will stop [place] as it is not helping  if not imp 3 mo - consider basal insulin opthy up to date foot care is good   The following medications were removed from the medication list:    Januvia 100 Mg Tabs (Sitagliptin phosphate) ..... One by mouth once daily in am His updated medication list for this problem includes:    Glipizide 10 Mg Tb24 (Glipizide) .Marland Kitchen... Take 1 tablet by mouth once a day    Metformin Hcl 500 Mg Tabs (Metformin hcl) .Marland Kitchen... Take 1 and 1/2 pills twice daily    Bayer Aspirin 325 Mg Tabs (Aspirin) ..... One by mouth qd  Complete Medication List: 1)  Furosemide 40 Mg Tabs (Furosemide) .... Take 1 tablet by mouth once daily prn 2)  Zocor 20 Mg Tabs (Simvastatin) .Marland Kitchen.. 1 by mouth once daily 3)  Gabapentin 800 Mg Tabs (Gabapentin) .... Take 1 tablet by mouth three times a day 4)  Glipizide 10 Mg Tb24 (Glipizide) .... Take 1 tablet by mouth once a day 5)  Metformin Hcl 500 Mg Tabs (Metformin hcl) .... Take 1 and 1/2 pills twice daily 6)  Avodart 0.5 Mg Caps (Dutasteride) .... One by mouth twice a week 7)  Bayer Aspirin 325 Mg Tabs (Aspirin) .... One by mouth qd 8)  Rythmol 225 Mg Tabs (Propafenone hcl) .... One by mouth two times a day 9)  Flomax 0.4 Mg Cp24 (Tamsulosin hcl) .... One by mouth qd 10)  Nitrofurantoin Monohyd Macro 100 Mg Caps (Nitrofurantoin monohyd macro) .Marland Kitchen.. 1 daily by mouth  Patient  Instructions: 1)  a good refresher bood for diabetic eating is called SUGAR BUSTERS 2)  eat diabetic diet - work hard on this  3)  if you want a referral to diabetic teaching -- please let me know  4)  schedule lab and then follow up in 3 months renal/AIC 250.0  Prior Medications (reviewed today): FUROSEMIDE 40 MG TABS (FUROSEMIDE) Take 1 tablet by mouth once daily prn ZOCOR 20 MG  TABS (SIMVASTATIN) 1 by mouth once daily GABAPENTIN 800 MG TABS (GABAPENTIN) Take 1 tablet by mouth three times a day GLIPIZIDE 10 MG TB24 (GLIPIZIDE) Take 1 tablet by mouth once a day METFORMIN HCL 500 MG TABS (METFORMIN HCL) Take 1 and 1/2 pills twice daily AVODART 0.5 MG  CAPS (DUTASTERIDE) one by mouth twice a week BAYER ASPIRIN 325 MG  TABS (ASPIRIN) one by mouth qd RYTHMOL  225 MG  TABS (PROPAFENONE HCL) one by mouth two times a day FLOMAX 0.4 MG  CP24 (TAMSULOSIN HCL) one by mouth qd NITROFURANTOIN MONOHYD MACRO 100 MG  CAPS (NITROFURANTOIN MONOHYD MACRO) 1 DAILY BY MOUTH Current Allergies: ! QUINIDINE ! * SPIRIVA

## 2010-05-18 NOTE — Assessment & Plan Note (Signed)
Summary: F/U AFTER LABS / LFW   Vital Signs:  Patient profile:   70 year old male Temp:     98 degrees F oral Pulse rate:   68 / minute Pulse rhythm:   regular BP sitting:   108 / 70  (left arm) Cuff size:   regular  Vitals Entered By: Lewanda Rife LPN (January 26, 2010 9:08 AM) CC: three month f/u after labs   History of Present Illness: here for f/u of DM and lipids   bp great 108/70  last visit disc some better diet and upper body exercise on lantus 20 u in eve AIC is up to 7.7 from 7.3 and sugar was 170 fasting  his sugars were good until he went to the hospital   originally 100- 140s in am and variable in afternoon  after hosp 160s to 190s am and sometimes over 200 in afternoons   is doing upper body exercise 20- 30 per day  eating was very good for a while  then after hosp -- is eating even less to get sugar down they did cut metformin and lantus for a little while -- back to normal now  went up to 25 units of lantus    lipids very well controlled with LDL 40 but low hdl andtrig 191 (likely from DM)  has chronic uti  has afib - has been hosp for that  on pradaxa and amiodarone is  doing ok with those   Allergies: 1)  ! Quinidine 2)  ! * Spiriva  Past History:  Past Medical History: Last updated: 06/28/2008 Atrial fibrillation COPD Diabetes mellitus, type II Hyperlipidemia spinal cord injury- wheelchair bound neurogenic bladder (occ infections) prior smoker- quit 2008 carotid stenosis chronic/freq utis  urol cardiol- Wall  Past Surgical History: Last updated: 12/30/2008 Cataract extraction Orchiectomy- left, secondary to torsion Spinal stenosis- decompression (1998) Cervical spine bone graft Cath (1992) EKG (1993) CXR (1997) LE arterial study (12/1995) Cervical diskectomy (1998, 2004) Cervical cord injury- C4 Colonoscopy (01/2001) Lumbar spinal stenosis- surgery (04/2003) Abd [place]- normal (06/1995) 2D Echo (07/2004) EF  50-55% Cardiolite- ? mild ischemia (07/2004) carotid doppler (7/09) R 60-79% stenosis, and L 0-39% carotid doppler (8/10) R 60-79% adn L 0-39% without change   Family History: Last updated: 11/28/2006 Father: killed in [place] Mother: deceased alzheimer's, stroke Siblings: brother- bypass surgery x 4  Social History: Last updated: 10/22/2007 Marital Status: separated (? divorced) Children: 3 Occupation: disabled long time smoker- quit 2008 no alcohol  Risk Factors: Smoking Status: current (11/28/2006)  Review of Systems General:  Denies fatigue, fever, loss of appetite, and malaise. Eyes:  Denies blurring and eye irritation. CV:  Complains of swelling of feet; denies chest pain or discomfort and lightheadness; no more than usual . Resp:  Denies cough and wheezing. GI:  Denies abdominal pain, bloody stools, change in bowel habits, nausea, and vomiting. GU:  Complains of urinary hesitancy; denies dysuria and hematuria. MS:  Complains of muscle weakness. Derm:  Denies itching, lesion(s), poor wound healing, and rash. Neuro:  Denies numbness and tingling. Psych:  Denies anxiety and depression. Endo:  Denies excessive thirst and excessive urination. Heme:  Denies abnormal bruising and bleeding.  Physical Exam  General:  wheelchair bound - generally well appearing Head:  normocephalic, atraumatic, and no abnormalities observed.   Eyes:  vision grossly intact, pupils equal, pupils round, and pupils reactive to light.  no conjunctival pallor, injection or icterus  Mouth:  pharynx pink and moist.  Neck:  supple with full rom and no masses or thyromegally, no JVD or carotid bruit  Lungs:  diffusely distant bs without rales or crackles no wheeze today- good air exch Heart:  RRR today no M or gallop Abdomen:  Bowel sounds positive,abdomen soft and non-tender without masses, organomegaly or hernias noted. no renal bruits no suprapubic tenderness or fullness felt  Msk:  wheelchair  bound from prior back injury Pulses:  plus one pedal pulses Extremities:  trace left pedal edema and trace right pedal edema. ankles with rubor that is baseline   Neurologic:  baseline neurologic changes in legs- sensory and motor   Skin:  Intact without suspicious lesions or rashes Cervical Nodes:  No lymphadenopathy noted Psych:  normal affect, talkative and pleasant   Diabetes Management Exam:    Foot Exam (with socks and/or shoes not present):       Sensory-Pinprick/Light touch:          Left medial foot (L-4): diminished          Left dorsal foot (L-5): diminished          Left lateral foot (S-1): diminished          Right medial foot (L-4): diminished          Right dorsal foot (L-5): diminished          Right lateral foot (S-1): diminished   Impression & Recommendations:  Problem # 1:  ATRIAL FLUTTER (ICD-427.32) Assessment Improved in control with current meds will continue cardiac f/u no chf symptoms  leg edema is stable His updated medication list for this problem includes:    Amiodarone Hcl 200 Mg Tabs (Amiodarone hcl) .Marland Kitchen... Take one tablet by mouth twice a day  Problem # 2:  DIABETES MELLITUS, TYPE II (ICD-250.00) Assessment: Deteriorated  this is worse- poss with med change and hosp inc lantus to 35 u daily continue better diet and exercise f/u 3 mo after labs  His updated medication list for this problem includes:    Glipizide 10 Mg Tb24 (Glipizide) .Marland Kitchen... Take 1 tablet by mouth once a day    Metformin Hcl 500 Mg Tabs (Metformin hcl) .Marland Kitchen... Take 1 1/2 tablets by mouth two times a day    Lantus 100 Unit/ml Soln (Insulin glargine) ..... Inject 20 units as directed each evening  Orders: TLB-Microalbumin/Creat Ratio, Urine (82043-MALB)  Labs Reviewed: Creat: 0.9 (01/23/2010)     Last Eye Exam: normal (07/08/2009) Reviewed HgBA1c results: 7.7 (01/23/2010)  7.3 (10/18/2009)  Problem # 3:  HYPERLIPIDEMIA (ICD-272.4) Assessment: Improved  in very good  control with statin and diet suspect we will see imp in the trig with better sugar control  His updated medication list for this problem includes:    Zocor 20 Mg Tabs (Simvastatin) .Marland Kitchen... 1 by mouth once daily  Labs Reviewed: SGOT: 12 (01/23/2010)   SGPT: 13 (01/23/2010)  Prior 10 Yr Risk Heart Disease: 18 % (06/28/2008)   HDL:30.20 (01/23/2010), 37.80 (04/06/2009)  LDL:40 (01/23/2010), 51 (04/06/2009)  Chol:108 (01/23/2010), 125 (04/06/2009)  Trig:191.0 (01/23/2010), 179.0 (04/06/2009)  Complete Medication List: 1)  Furosemide 40 Mg Tabs (Furosemide) .... Take 1 tablet by mouth once daily as needed 2)  Zocor 20 Mg Tabs (Simvastatin) .Marland Kitchen.. 1 by mouth once daily 3)  Gabapentin 800 Mg Tabs (Gabapentin) .... Take 1/2  tablet by four times daily 4)  Glipizide 10 Mg Tb24 (Glipizide) .... Take 1 tablet by mouth once a day 5)  Metformin Hcl 500 Mg Tabs (Metformin hcl) .Marland KitchenMarland KitchenMarland Kitchen  Take 1 1/2 tablets by mouth two times a day 6)  Avodart 0.5 Mg Caps (Dutasteride) .... One by mouth twice a week 7)  Flomax 0.4 Mg Cp24 (Tamsulosin hcl) .... One by mouth daily 8)  Nitrofurantoin Monohyd Macro 100 Mg Caps (Nitrofurantoin monohyd macro) .Marland Kitchen.. 1 daily by mouth 9)  Lantus 100 Unit/ml Soln (Insulin glargine) .... Inject 20 units as directed each evening 10)  Insulin Syringes and Needles  .... To inject lantus insulin once daily as directed for dm2 250.0 11)  Pradaxa 150 Mg Caps (Dabigatran etexilate mesylate) .... Take 1 tablet by mouth two times a day 12)  Amiodarone Hcl 200 Mg Tabs (Amiodarone hcl) .... Take one tablet by mouth twice a day  Other Orders: Flu Vaccine 83yrs + MEDICARE PATIENTS (Z6109) Administration Flu vaccine - MCR (U0454)  Patient Instructions: 1)  increase your lantus from 25 to 35 units once daily  2)  keep checking sugars 3)  keep working on diet and exercise  4)  if you have low sugars let me know 5)  cholesterol is well controlled  6)  schedule labs in 3 months for [facility] and renal 250.0  and then follow up   Current Allergies (reviewed today): ! QUINIDINE ! * SPIRIVA     Flu Vaccine Consent Questions     Do you have a history of severe allergic reactions to this vaccine? no    Any prior history of allergic reactions to egg and/or gelatin? no    Do you have a sensitivity to the preservative Thimersol? no    Do you have a past history of Guillan-Barre Syndrome? no    Do you currently have an acute febrile illness? no    Have you ever had a severe reaction to latex? no    Vaccine information given and explained to patient? yes    Are you currently pregnant? no    Lot Number:[id number]   Exp Date:10/14/2010   Site Given  Left Deltoid IMflu1 Lewanda Rife LPN  January 26, 2010 9:15 AM

## 2010-05-18 NOTE — Progress Notes (Signed)
Summary: blood sugar readings  Phone Note Call from Patient   Caller: Patient Call For: Judith Part MD Summary of Call: Pt has dropped off his list of blood sugar readings, list is on your shelf. Initial call taken by: Lowella Petties CMA,  Aug 16, 2009 8:53 AM  Follow-up for Phone Call        thanks for the update - I flagged document to scan looks like some imp in his PM sugars please inc lantus to 20 units once daily (is currently on 10 ) then send me another sugar record in 2 weeks - thanks  try to stick to DM diet  Follow-up by: Judith Part MD,  Aug 16, 2009 1:16 PM  Additional Follow-up for Phone Call Additional follow up Details #1::        Left message for patient to call back. Lewanda Rife LPN  Aug 16, 1608 4:42 PM   Advised pt.            Lowella Petties CMA  Aug 16, 2009 4:48 PM     New/Updated Medications: LANTUS 100 UNIT/ML SOLN (INSULIN GLARGINE) inject 20 units as directed each evening

## 2010-05-18 NOTE — Progress Notes (Signed)
Summary: Medication Refill  Phone Note From Pharmacy   [facility]  [address] [id number] [phone] Summary of Call: Received electronic request for -Metoprolol 25mg  1 by mouth two times a day # 60                                                    -Diltiazem ER 120mg  1 by mouth once daily # 30                                                    -Digoxin 250mg  1 by mouth once daily # 30  None on med list or listed in last office note. Please advise.  Initial call taken by: Janee Morn CMA Duncan Dull),  February 22, 2010 2:54 PM  Follow-up for Phone Call        verify these with him since not on list  ? from cardiology Follow-up by: Judith Part MD,  February 22, 2010 3:10 PM  Additional Follow-up for Phone Call Additional follow up Details #1::        Left message for patient to call back. Lewanda Rife LPN  February 22, 2010 4:57 PM   spoke with patient he's not taking any of the medication listed above, pt states he didn't call meds into pharmacy. DeShannon Smith CMA Duncan Dull)  February 23, 2010 11:00 AM     Additional Follow-up for Phone Call Additional follow up Details #2::    thanks - please let pharmacy know if needed Follow-up by: Judith Part MD,  February 23, 2010 11:59 AM  Additional Follow-up for Phone Call Additional follow up Details #3:: Details for Additional Follow-up Action Taken: [facility]ist notified as instructed by telephone. Lewanda Rife LPN  February 23, 2010 12:39 PM

## 2010-05-18 NOTE — Assessment & Plan Note (Signed)
Summary: EC6/AMD   Visit Type:  Follow-up Primary Artez Regis:  Judith Part MD  CC:  F/U [facility] s/p A-Fib and CHF.Marland Kitchen  History of Present Illness: Mr. 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 Daleen Squibb. (Had multiple DC-CVs prior to Rhythmol)  Myoview 2006 EF 54%. no ischemia.   Carotid dopplers: 0-39% L 60-79% R  Admitted 2 weeks ago to [facility] with tachycardia and CHF. Initially felt that it might be atrial tach but then was became irregular and was atrial fib. Echo showed EF 45% (down from 50-55%) with mild to moderately reduced RV function and diastolic dysfunction. Diuresed and rate controlled. Rhtyhmol stopped. Started on Pradaxa and plans made for outpatient DC-CV in 4 weeks.   Returns to the office with his daughter (who is anm ED nurse at [employer]). Doing fairly well. Denies recurrent palpitations but says he feels sluggish when his HR is under 70. No CP, orthopnea or PND. Chronic LE edema. No bleeding on pradaxa.    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 Take 1 Tablet By Mouth Once Daily As Needed 2)  Zocor 20 Mg  Tabs (Simvastatin) .Marland Kitchen.. 1 By Mouth Once Daily 3)  Gabapentin 800 Mg Tabs (Gabapentin) .... Take 1/2  Tablet By Four Times Daily 4)  Glipizide 10 Mg  Tb24 (Glipizide) .... Take 1 Tablet By Mouth Once A Day 5)  Metformin Hcl 500 Mg Tabs (Metformin Hcl) .... Take 1 and 1/2 Pills Twice Daily 6)  Avodart 0.5 Mg  Caps (Dutasteride) .... One By Mouth Twice A Week 7)  Bayer Aspirin 325 Mg  Tabs (Aspirin) .... One By Mouth Daily 8)  Rythmol 225 Mg  Tabs (Propafenone Hcl) .... One By Mouth Two Times A Day 9)  Flomax 0.4 Mg  Cp24 (Tamsulosin Hcl) .... One By Mouth Daily 10)  Nitrofurantoin Monohyd Macro 100 Mg  Caps (Nitrofurantoin Monohyd Macro) .Marland Kitchen.. 1 Daily By Mouth 11)  Lantus 100 Unit/ml Soln (Insulin Glargine) .... Inject 20 Units As Directed Each Evening 12)  Insulin Syringes and Needles .... To Inject Lantus Insulin Once Daily As Directed For Dm2 250.0  Current Medications (verified): 1)  Furosemide 40 Mg Tabs (Furosemide) .... Take 1 Tablet By Mouth Once Daily As Needed 2)  Zocor 20 Mg  Tabs (Simvastatin) .Marland Kitchen.. 1 By Mouth Once Daily 3)  Gabapentin 800 Mg Tabs (Gabapentin) .... Take 1/2  Tablet By Four Times Daily 4)  Glipizide 10 Mg Tb24 (Glipizide) .... Take 1 Tablet By Mouth Once A Day 5)  Metformin Hcl 500 Mg Tabs (Metformin Hcl) .... Take 1 Two Times A Day 6)  Avodart 0.5 Mg  Caps (Dutasteride) .... One By Mouth Twice A Week 7)  Flomax 0.4 Mg  Cp24 (Tamsulosin Hcl) .... One By Mouth  Daily 8)  Nitrofurantoin Monohyd Macro 100 Mg  Caps (Nitrofurantoin Monohyd Macro) .Marland Kitchen.. 1 Daily By Mouth 9)  Lantus 100 Unit/ml Soln (Insulin Glargine) .... Inject 20 Units As Directed Each Evening 10)  Insulin Syringes and Needles .... To Inject Lantus Insulin Once Daily As Directed For Dm2 250.0 11)  Pradaxa 150 Mg Caps (Dabigatran Etexilate Mesylate) .... One Tablet Once Daily 12)  Cardizem Cd 120 Mg Xr24h-Cap (Diltiazem Hcl Coated Beads) .... One Tablet Once Daily 13)  Lanoxin 0.25 Mg Tabs (Digoxin) .... One Tablet Once Daily 14)  Metoprolol Tartrate 25 Mg Tabs (Metoprolol Tartrate) .... One Tablet Two Times A Day  Allergies (verified): 1)  !  Quinidine 2)  ! * Spiriva  Past History:  Past Medical History: Last updated: 06/28/2008 Atrial fibrillation COPD Diabetes mellitus, type II Hyperlipidemia spinal cord injury- wheelchair bound neurogenic bladder (occ infections) prior smoker- quit 2008 carotid stenosis chronic/freq utis  urol cardiol- Wall  Past Surgical History: Last updated: 12/30/2008 Cataract extraction Orchiectomy- left, secondary to torsion Spinal stenosis- decompression (1998) Cervical spine bone graft Cath (1992) EKG (1993) CXR (1997) LE arterial study (12/1995) Cervical diskectomy (1998, 2004) Cervical cord injury- C4 Colonoscopy (01/2001) Lumbar spinal stenosis- surgery (04/2003) Abd [place]- normal (06/1995) 2D Echo (07/2004) EF 50-55% Cardiolite- ? mild ischemia (07/2004) carotid doppler (7/09) R 60-79% stenosis, and L 0-39% carotid doppler (8/10) R 60-79% adn L 0-39% without change   Family History: Last updated: 11/28/2006 Father: killed in [place] Mother: deceased alzheimer's, stroke Siblings: brother- bypass surgery x 4  Social History: Last updated: 10/22/2007 Marital Status: separated (? divorced) Children: 3 Occupation: disabled long time smoker- quit 2008 no alcohol  Risk Factors: Smoking Status: current (11/28/2006)  Review of Systems       As per HPI and past medical history; otherwise all systems negative.   Vital Signs:  Patient profile:   70 year old male Pulse rate:   65 / minute BP sitting:   104 / 60  (left arm) Cuff size:   regular  Vitals Entered By: Bishop Dublin, CMA (January 13, 2010 4:40 PM)  Physical Exam  General:  Elderly man sitting in W-C. well appearing. no resp difficulty HEENT: normal Neck: supple. scar on L. no JVD. Carotids 2+ bilat; no bruits. No lymphadenopathy or thryomegaly appreciated. Cor: PMI nondisplaced. Regular rate & rhythm. No rubs, gallops, murmur. Lungs: clear with decreased air movement and long exp phase Abdomen: soft,  nontender, nondistended.  Good bowel sounds. Extremities: no cyanosis, clubbing, rash, 2+ edema bilat Neuro: alert & orientedx3, cranial nerves grossly intact. limited movement of upper extremities. plgic from waist down    Impression & Recommendations:  Problem # 1:  ATRIAL FIBRILLATION (ICD-427.31) He is now back in SR. We had a long talk regarding rate vs rhythm control and ASA vs coumadin or Pradaxa. Given the problems he has had in the past with AF, I told him that he was likely going to need an anti-arrhytmic agent in the future but I was happy to see if we could succeed with rate control. We have decided to proceed with rhythm control.   Due to issues with bradycardia, I suggested he consider Tikosyn but he did not want to go back into the hospital for a load so he has opted for amiodarone. We discussed the side effects including pulmonary fibrosis clearly. Will stop digoxin, lopressor and cardizem and start amio 200 two times a day. Can decrease to 200 once daily at next visit. Given that  his CHADS score is 3-4 I have recommended anticoagulation. Despite the price, he prefers Pradaxa as he has a hard time with ransportation to get INRs checked and he prefers not to use home health services.  Problem # 2:  SYSTOLIC HEART FAILURE, CHRONIC (ICD-428.22) I suspect LV dysfunction was rate-related and should improved with control of his AF. We will repeat echo at next visit. If not, will likely need addition of ACE-I and b-blocker as BP tolerates. as chronic LE edema mostly due to venous stasis. Refuses to wear compression hose as he feels these just move edema to his thighs.   Problem # 3:  CAROTID OCCLUSIVE DISEASE (ICD-433.10) Asymptomatic. Will need follow u/s scheduled at next visit.   Patient Instructions: 1)  Your physician has recommended you make the following change in your medication: STOP cardizem, digoxin, metoprolol START amiodarone 200mg  two times a day  2)  Your physician wants  you to follow-up in:   3 months You will receive a reminder letter in the mail two months in advance. If you don't receive a letter, please call our office to schedule the follow-up appointment. 3)  Your physician has requested that you have an echocardiogram.  Echocardiography is a painless test that uses sound waves to create images of your heart. It provides your doctor with information about the size and shape of your heart and how well your heart's chambers and valves are working.  This procedure takes approximately one hour. There are no restrictions for this procedure. Prescriptions: PRADAXA 150 MG CAPS (DABIGATRAN ETEXILATE MESYLATE) Take 1 tablet by mouth two times a day  #60 x 6   Entered by:   Benedict Needy, RN   Authorized by:   Dolores Patty, MD, [facility]   Signed by:   Benedict Needy, RN on 01/13/2010   Method used:   [facility]        [facility]  [address] [id number]       [address]       [place], [place]  [address]       Ph: [phone] or [phone]       Fax: [phone]   RxID:   [id number] AMIODARONE HCL 200 MG TABS (AMIODARONE HCL) Take one tablet by mouth twice a day  #60 x 6   Entered by:   Benedict Needy, RN   Authorized by:   Dolores Patty, MD, [facility]   Signed by:   Benedict Needy, RN on 01/13/2010   Method used:   [facility]        [facility]  [address] [id number] ([facility]ail[facility]       [address]       [place], [place]  [address]       Ph: [phone] or [phone]       Fax: [phone]   RxID:   [id number]   Appended Document: Orders Update    Clinical Lists Changes  Orders: Added new Referral order of Echocardiogram (Echo) - Signed

## 2010-05-18 NOTE — Letter (Signed)
Summary: Pt's Blood Sugar Readings  Pt's Blood Sugar Readings   Imported By: Beau Fanny 09/02/2009 09:25:50  _____________________________________________________________________  External Attachment:    Type:   Image     Comment:   External Document  Appended Document: Pt's Blood Sugar Readings sugars are definitely improving  stay on current lantus dose and really watch diet as carefully as he can  schedule labs in july about a week before f/u if poss for renal and AIC 250.0  then will disc further at f/u  Appended Document: Pt's Blood Sugar Readings Patient notified as instructed by telephone. lab appointment scheduled as instructed 10/18/09 at 8:15am.

## 2010-05-18 NOTE — Progress Notes (Signed)
----   Converted from flag ---- ---- 04/25/2010 8:13 AM, Judith Part MD wrote: no -- don't do the lipid/ast/alt - thanks  ---- 04/25/2010 7:55 AM, Liane Comber CMA (AAMA) wrote: This pt is scheduled for labs, your last ov 01/26/2010 says to check for a1c and renal, but pt is on the schedule for those labs plus lipids and alt,ast. Did you want to recheck lipids, alt,ast?  Thanks Tasha ------------------------------

## 2010-05-18 NOTE — Progress Notes (Signed)
Summary: called pt  Phone Note Outgoing Call Call back at [facility] Phone [phone]   Call placed by: Harlon Flor,  January 16, 2010 10:40 AM Call placed to: Patient Summary of Call: [facility] to schedule ECHO appt Initial call taken by: Harlon Flor,  January 16, 2010 10:41 AM

## 2010-05-18 NOTE — Progress Notes (Signed)
Summary: Diabetes testing supplies [employer])  Phone Note From Pharmacy Call back at [phone] fax [phone]   Caller: [facility]  Call For: Dr. Milinda Antis  Summary of Call: Received order for diabetes testing supplies.  Spoke with patient and he only gets the test strips for this company.  Form in your IN box.   Initial call taken by: Linde Gillis CMA Duncan Dull),  Aug 18, 2009 8:25 AM  Follow-up for Phone Call        form done and in nurse in box  Follow-up by: Judith Part MD,  Aug 18, 2009 8:42 AM  Additional Follow-up for Phone Call Additional follow up Details #1::        completed form faxed to [phone] as instructed. Form is at Rena's desk if needed later.Lewanda Rife LPN  Aug 19, 1322 11:03 AM

## 2010-05-18 NOTE — Letter (Signed)
Summary: [facility]   Imported By: Lanelle Bal 08/23/2009 11:17:13  _____________________________________________________________________  External Attachment:    Type:   Image     Comment:   External Document

## 2010-05-18 NOTE — Progress Notes (Signed)
Summary: Gabapentin 800mg  verification instructions  Phone Note Refill Request Call back at [phone] Message from:  [facility] on December 23, 2009 10:06 AM  Refills Requested: Medication #1:  GABAPENTIN 800 MG TABS Take 1/2  tablet by mouth three times a day [facility] requested refill for Gabapentin 800mg  taking one tablet three times a day. Pt was seen on 10/25/09 and was taking Gabapentin 800mg  taking 1/2 tab three times a day. I have left a message for pt to call me to verify how taking.Lewanda Rife LPN  December 23, 2009 10:08 AM    Method Requested: Electronic Initial call taken by: Lewanda Rife LPN,  December 23, 2009 10:08 AM  Follow-up for Phone Call        Pt stated he has been taking Gabapentin 800mg  taking 1/2 tab four times a day.Please advise. Lewanda Rife LPN  December 23, 2009 10:48 AM   let him know this is generally dosed 3 times daily (unless another physician inst him to do that )  I want to keep it 1/2 three times a day but update me if this does not work px written on EMR for call in  Follow-up by: Judith Part MD,  December 23, 2009 11:16 AM  Additional Follow-up for Phone Call Additional follow up Details #1::        Patient notified as instructed by telephone. Pt said if takes 1/2 three times a day his neck pops. If pt takes 1/2 four times a day his neck does not pop or bother him . Pt request qid. Please advise. Lewanda Rife LPN  December 23, 2009 11:39 AM   it is not advised to take qid but because top dose is 1800 and this is under that -- can go ahead and ok it  px written on EMR for call in  because this is not recommended dose- if insurance will not pay for this much - there will be nothing I can do-- just make him aware of that - thanks  Additional Follow-up by: Judith Part MD,  December 23, 2009 11:46 AM    Additional Follow-up for Phone Call Additional follow up Details #2::    Patient notified as instructed by telephone.  [facility] as instructed. Spoke with pharmacist Elijah Birk.Lewanda Rife LPN  December 23, 2009 3:29 PM   New/Updated Medications: GABAPENTIN 800 MG TABS (GABAPENTIN) Take 1/2  tablet by mouth three times a day GABAPENTIN 800 MG TABS (GABAPENTIN) Take 1/2  tablet by four times daily  Prescriptions: GABAPENTIN 800 MG TABS (GABAPENTIN) Take 1/2  tablet by four times daily  #3 months 3 x 0   Entered and Authorized by:   Judith Part MD   Signed by:   Judith Part MD on 12/23/2009   Method used:   Telephoned to ...       [facility] (retail)       [address]       [place], [place]  [address]       Ph: [phone] or [phone]       Fax: [phone]   RxID:   [id number] GABAPENTIN 800 MG TABS (GABAPENTIN) Take 1/2  tablet by mouth three times a day  #135 x 3   Entered and Authorized by:   Judith Part MD   Signed by:   Judith Part MD on 12/23/2009   Method  used:   Telephoned to ...       [facility] (retail)       [address]       [place], [place]  [address]       Ph: [phone] or [phone]       Fax: [phone]   RxID:   [id number]

## 2010-05-18 NOTE — Letter (Signed)
Summary: Pt's Sugar Readings  Pt's Sugar Readings   Imported By: Beau Fanny 08/18/2009 14:50:37  _____________________________________________________________________  External Attachment:    Type:   Image     Comment:   External Document

## 2010-05-18 NOTE — Consult Note (Signed)
Summary: [facility]   Imported By: Lanelle Bal 07/19/2009 11:18:03  _____________________________________________________________________  External Attachment:    Type:   Image     Comment:   External Document  Appended Document: [facility]    Clinical Lists Changes  Observations: Added new observation of DMEYEEXAMNXT: 07/2010 (07/19/2009 20:35) Added new observation of DMEYEEXMRES: normal (07/08/2009 20:36) Added new observation of EYE EXAM BY: Dr Inez Pilgrim (07/08/2009 20:36) Added new observation of DIAB EYE EX: normal (07/08/2009 20:36)        Diabetes Management Exam:    Eye Exam:       Eye Exam done elsewhere          Date: 07/08/2009          Results: normal          Done by: Dr Inez Pilgrim

## 2010-05-18 NOTE — Assessment & Plan Note (Signed)
Summary: FOLLOW UP / LFW   Vital Signs:  Patient profile:   70 year old male Temp:     97.7 degrees F oral Pulse rate:   68 / minute Pulse rhythm:   regular BP sitting:   96 / 70  (left arm) Cuff size:   regular  Vitals Entered By: Lewanda Rife LPN (October 25, 2009 9:10 AM) CC: three month f/u after labs   History of Present Illness: here for f/u of DM   started lantus insulin now on 20 u daily and sugars are coming down recent labs fasting was 80 and AIC down from 8 to 7.3  is used to lantus  am sugars are from usually 80s-120s  afternoons are lower - varies from 200s to one teens  really depends on what he eats  occ gets low -- usually if he skips a meal (lunch)  once had a 43   is following his DM diet - 80 % of the time  has to get more motivated  when he cheats - is quite bad - example big meal   other days - may eat too much brown bread  occ too much fruit  sweets are very rare   has good days and bad days in how he feels -- better with lower sugars  thinks he may gain some weight   Allergies: 1)  ! Quinidine 2)  ! * Spiriva  Past History:  Past Medical History: Last updated: 06/28/2008 Atrial fibrillation COPD Diabetes mellitus, type II Hyperlipidemia spinal cord injury- wheelchair bound neurogenic bladder (occ infections) prior smoker- quit 2008 carotid stenosis chronic/freq utis  urol cardiol- Wall  Past Surgical History: Last updated: 12/30/2008 Cataract extraction Orchiectomy- left, secondary to torsion Spinal stenosis- decompression (1998) Cervical spine bone graft Cath (1992) EKG (1993) CXR (1997) LE arterial study (12/1995) Cervical diskectomy (1998, 2004) Cervical cord injury- C4 Colonoscopy (01/2001) Lumbar spinal stenosis- surgery (04/2003) Abd [place]- normal (06/1995) 2D Echo (07/2004) EF 50-55% Cardiolite- ? mild ischemia (07/2004) carotid doppler (7/09) R 60-79% stenosis, and L 0-39% carotid doppler (8/10) R 60-79% adn  L 0-39% without change   Family History: Last updated: 11/28/2006 Father: killed in [place] Mother: deceased alzheimer's, stroke Siblings: brother- bypass surgery x 4  Social History: Last updated: 10/22/2007 Marital Status: separated (? divorced) Children: 3 Occupation: disabled long time smoker- quit 2008 no alcohol  Risk Factors: Smoking Status: current (11/28/2006)  Review of Systems General:  Denies chills, fever, loss of appetite, and malaise. Eyes:  Denies blurring and eye irritation. CV:  Denies chest pain or discomfort, lightheadness, palpitations, and shortness of breath with exertion. Resp:  Denies cough, shortness of breath, and wheezing. GI:  Denies abdominal pain, change in bowel habits, indigestion, and nausea. GU:  Denies dysuria. MS:  Complains of muscle weakness and stiffness; denies muscle aches. Derm:  Denies itching, lesion(s), poor wound healing, and rash. Neuro:  Denies numbness and tingling. Psych:  Denies anxiety and depression. Endo:  Denies cold intolerance, excessive thirst, excessive urination, and heat intolerance. Heme:  Denies abnormal bruising and bleeding.  Physical Exam  General:  Well-developed,well-nourished,in no acute distress; alert,appropriate and cooperative throughout examination- is wheelchair bound/ and mildly overwt  Head:  normocephalic, atraumatic, and no abnormalities observed.   Eyes:  vision grossly intact, pupils equal, pupils round, and pupils reactive to light.  no conjunctival pallor, injection or icterus  Ears:  L ear normal.   Nose:  no nasal discharge.   Mouth:  pharynx  pink and moist.   Neck:  supple with full rom and no masses or thyromegally, no JVD or carotid bruit  Lungs:  diffusely distant bs without rales or crackles no wheeze today- good air exch Heart:  irregularly irregular rhythm  no M or gallop Abdomen:  no suprapubic tenderness or fullness felt  Msk:  wheelchair bound from prior back injury Pulses:   plus one pedal pulses Extremities:  trace left pedal edema and trace right pedal edema. ankles with rubor that is baseline   Neurologic:  baseline neurologic changes in legs- sensory and motor   Skin:  Intact without suspicious lesions or rashes Cervical Nodes:  No lymphadenopathy noted Inguinal Nodes:  No significant adenopathy Psych:  normal affect, talkative and pleasant   Diabetes Management Exam:    Foot Exam (with socks and/or shoes not present):       Sensory-Pinprick/Light touch:          Left medial foot (L-4): diminished          Left dorsal foot (L-5): diminished          Left lateral foot (S-1): diminished          Right medial foot (L-4): diminished          Right dorsal foot (L-5): diminished          Right lateral foot (S-1): diminished       Sensory-Monofilament:          Left foot: diminished          Right foot: diminished       Inspection:          Left foot: normal          Right foot: abnormal   Impression & Recommendations:  Problem # 1:  DIABETES MELLITUS, TYPE II (ICD-250.00) Assessment Improved  this is improved with lantus and also some imp in chronic utis  no change in meds  could imp control further if DM diet was more strict- disc this in detail also urged to do upper body exercise at least 20 min per day  lab and f/u in 3 mo  opthy up to date  His updated medication list for this problem includes:    Glipizide 10 Mg Tb24 (Glipizide) .Marland Kitchen... Take 1 tablet by mouth once a day    Metformin Hcl 500 Mg Tabs (Metformin hcl) .Marland Kitchen... Take 1 and 1/2 pills twice daily    Bayer Aspirin 325 Mg Tabs (Aspirin) ..... One by mouth daily    Lantus 100 Unit/ml Soln (Insulin glargine) ..... 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 1 daily by mouth  Problem # 3:  HYPERLIPIDEMIA (ICD-272.4) Assessment: Unchanged  this has been well imp- will check at next labs rev low sat fat diet  His updated medication list for this problem includes:    Zocor 20 Mg Tabs (Simvastatin) .Marland Kitchen... 1 by mouth once daily  Labs Reviewed: SGOT: 12 (04/06/2009)   SGPT: 17 (04/06/2009)  Prior 10 Yr Risk Heart Disease: 18 % (06/28/2008)   HDL:37.80 (  04/06/2009), 37.7 (06/28/2008)  LDL:51 (04/06/2009), 60 (06/28/2008)  Chol:125 (04/06/2009), 131 (06/28/2008)  Trig:179.0 (04/06/2009), 166 (06/28/2008)  Complete Medication List: 1)  Furosemide 40 Mg Tabs (Furosemide) .... Take 1 tablet by mouth once daily as needed 2)  Zocor 20 Mg Tabs (Simvastatin) .Marland Kitchen.. 1 by mouth once daily 3)  Gabapentin 800 Mg Tabs (Gabapentin) .... Take 1/2  tablet by mouth three times a day 4)  Glipizide 10 Mg Tb24 (Glipizide) .... Take 1 tablet by mouth once a day 5)  Metformin Hcl 500 Mg Tabs (Metformin hcl) .... Take 1 and 1/2 pills twice daily 6)  Avodart 0.5 Mg Caps (Dutasteride) .... One by mouth twice a week 7)  Bayer Aspirin 325 Mg Tabs (Aspirin) .... One by mouth daily 8)  Rythmol 225 Mg Tabs (Propafenone hcl) .... One by mouth two times a day 9)  Flomax 0.4 Mg Cp24 (Tamsulosin hcl) .... One by mouth daily 10)  Nitrofurantoin Monohyd Macro 100 Mg Caps (Nitrofurantoin monohyd macro) .Marland Kitchen.. 1 daily by mouth 11)  Lantus 100 Unit/ml Soln (Insulin glargine) .... Inject 20 units as directed each evening 12)  Insulin Syringes and Needles  .... To inject lantus insulin once daily as directed for dm2 250.0  Patient Instructions: 1)  continue the arm exercise - regularly  2)  no change in medicines  3)  update me if sugars get too low  4)  try your  best to stick with diabetic diet  5)  fasting labs in 3 months and then follow up lipids and ast/alt/ renal /AIC/ microalbumin / renal 250.0, 272   Current Allergies (reviewed today): ! QUINIDINE ! * SPIRIVA

## 2010-05-18 NOTE — Assessment & Plan Note (Signed)
Summary: ROA  FOR FOLLOW-UP/JRR   Vital Signs:  Patient profile:   70 year old male Temp:     98.1 degrees F oral Pulse rate:   64 / minute Pulse rhythm:   regular BP sitting:   98 / 66  (left arm) Cuff size:   regular  Vitals Entered By: Lewanda Rife LPN (July 26, 2009 8:16 AM) CC: follow up   History of Present Illness: here for f/u of DM  has been feeling nl - not bad   last visit rev diet which wasvery bad pt states he went to DM classes and knows what to eat   this check sugar 222 also AIC up from 7.9 to 8.0   he did make a big diet change stopped sweets  very little bread - brown  eating less  more green vegetables -- made him too weak and fell down   does not remember meal composition from his DM teaching   eats a lot of celery and carrots and cauli flower - greens and green beans   sugars are  ams 160s in am , 230s after meal 2 hour pp  thinks he still has bladder infection -- is on nitrofurantoin proph and that does not seem to be working  burning and odor  sees urologist next week    can have family member give him shot     Allergies: 1)  ! Quinidine 2)  ! * Spiriva  Past History:  Past Medical History: Last updated: 06/28/2008 Atrial fibrillation COPD Diabetes mellitus, type II Hyperlipidemia spinal cord injury- wheelchair bound neurogenic bladder (occ infections) prior smoker- quit 2008 carotid stenosis chronic/freq utis  urol cardiol- Wall  Past Surgical History: Last updated: 12/30/2008 Cataract extraction Orchiectomy- left, secondary to torsion Spinal stenosis- decompression (1998) Cervical spine bone graft Cath (1992) EKG (1993) CXR (1997) LE arterial study (12/1995) Cervical diskectomy (1998, 2004) Cervical cord injury- C4 Colonoscopy (01/2001) Lumbar spinal stenosis- surgery (04/2003) Abd [place]- normal (06/1995) 2D Echo (07/2004) EF 50-55% Cardiolite- ? mild ischemia (07/2004) carotid doppler (7/09) R 60-79%  stenosis, and L 0-39% carotid doppler (8/10) R 60-79% adn L 0-39% without change   Family History: Last updated: 11/28/2006 Father: killed in [place] Mother: deceased alzheimer's, stroke Siblings: brother- bypass surgery x 4  Social History: Last updated: 10/22/2007 Marital Status: separated (? divorced) Children: 3 Occupation: disabled long time smoker- quit 2008 no alcohol  Risk Factors: Smoking Status: current (11/28/2006)  Review of Systems General:  Complains of fatigue; denies chills, fever, loss of appetite, and malaise. Eyes:  Denies blurring and eye irritation. CV:  Denies chest pain or discomfort, palpitations, and shortness of breath with exertion. Resp:  Denies cough and wheezing. GI:  Denies abdominal pain, bloody stools, and change in bowel habits. GU:  Complains of dysuria and urinary frequency; denies hematuria. Derm:  Denies itching, lesion(s), poor wound healing, and rash. Neuro:  Complains of numbness, tingling, and weakness. Psych:  mood is fair . Endo:  Denies cold intolerance, excessive thirst, excessive urination, and heat intolerance. Heme:  Denies abnormal bruising and bleeding.  Physical Exam  General:  Well-developed,well-nourished,in no acute distress; alert,appropriate and cooperative throughout examination Head:  normocephalic and atraumatic.   Eyes:  vision grossly intact, pupils equal, pupils round, and pupils reactive to light.   Mouth:  pharynx pink and moist.   Neck:  supple with full rom and no masses or thyromegally, no JVD or carotid bruit  Lungs:  diffusely distant bs without rales  or crackles no wheeze today- good air exch Heart:  irregularly irregular rhythm  no M or gallop Abdomen:  mild suprapubic tenerness without rebound or gaurding Msk:  No deformity or scoliosis noted of thoracic or lumbar spine.   Pulses:  plus one pedal pulses Extremities:  trace left pedal edema and trace right pedal edema. ankles with rubor that is  baseline   Neurologic:  baseline neurologic changes in legs- sensory and motor   Skin:  tanned  no rash  Cervical Nodes:  No lymphadenopathy noted Inguinal Nodes:  No significant adenopathy Psych:  nl affect   Diabetes Management Exam:    Foot Exam (with socks and/or shoes not present):       Sensory-Pinprick/Light touch:          Left medial foot (L-4): normal          Left dorsal foot (L-5): normal          Left lateral foot (S-1): normal          Right medial foot (L-4): normal          Right dorsal foot (L-5): normal          Right lateral foot (S-1): normal       Sensory-Monofilament:          Left foot: normal          Right foot: normal       Inspection:          Left foot: normal          Right foot: normal       Nails:          Left foot: normal          Right foot: normal   Impression & Recommendations:  Problem # 1:  DIABETES MELLITUS, TYPE II (ICD-250.00) Assessment Deteriorated  DM out of control even with improved diet (though I think DM class refresher would be good idea in future)  spent 25 min face to face time with pt disc DM diet and his habits 50% of which was spent on counseling and coordination of care  pt thinks high sugar is from chronic uti will add low dose lantus - to start at 10 units daily and titrate to resp -- will send record in 2 wk update if side eff or any low sugar may adj after urol eval  His updated medication list for this problem includes:    Glipizide 10 Mg Tb24 (Glipizide) .Marland Kitchen... Take 1 tablet by mouth once a day    Metformin Hcl 500 Mg Tabs (Metformin hcl) .Marland Kitchen... Take 1 and 1/2 pills twice daily    Bayer Aspirin 325 Mg Tabs (Aspirin) ..... One by mouth qd    Lantus 100 Unit/ml Soln (Insulin glargine) ..... Inject 10 units as directed each evening  Labs Reviewed: Creat: 0.9 (07/18/2009)     Last Eye Exam: normal (07/08/2009) Reviewed HgBA1c results: 8.0 (07/18/2009)  7.9 (04/06/2009)  Problem # 2:  URINARY TRACT INFECTION,  CHRONIC (ICD-599.0) Assessment: Deteriorated per pt very hard to control even with prophabx  he thinks current one is not working f/u urol next week and make plan His updated medication list for this problem includes:    Nitrofurantoin Monohyd Macro 100 Mg Caps (Nitrofurantoin monohyd macro) .Marland Kitchen... 1 daily by mouth  Complete Medication List: 1)  Furosemide 40 Mg Tabs (Furosemide) .... Take 1 tablet by mouth once daily prn 2)  Zocor 20 Mg Tabs (Simvastatin) .Marland KitchenMarland KitchenMarland Kitchen  1 by mouth once daily 3)  Gabapentin 800 Mg Tabs (Gabapentin) .... Take 1 tablet by mouth three times a day 4)  Glipizide 10 Mg Tb24 (Glipizide) .... Take 1 tablet by mouth once a day 5)  Metformin Hcl 500 Mg Tabs (Metformin hcl) .... Take 1 and 1/2 pills twice daily 6)  Avodart 0.5 Mg Caps (Dutasteride) .... One by mouth twice a week 7)  Bayer Aspirin 325 Mg Tabs (Aspirin) .... One by mouth qd 8)  Rythmol 225 Mg Tabs (Propafenone hcl) .... One by mouth two times a day 9)  Flomax 0.4 Mg Cp24 (Tamsulosin hcl) .... One by mouth qd 10)  Nitrofurantoin Monohyd Macro 100 Mg Caps (Nitrofurantoin monohyd macro) .Marland Kitchen.. 1 daily by mouth 11)  Lantus 100 Unit/ml Soln (Insulin glargine) .... Inject 10 units as directed each evening 12)  Insulin Syringes and Needles  .... To inject lantus insulin once daily as directed for dm2 250.0  Patient Instructions: 1)  inject 10 units of lantus insulin each evening  2)  keep watching sugars  3)  check sugar two times a day  4)  send me a sugar record in about 2 weeks 5)  follow up with me in 3 months  6)  I sent px for lantus to pharmacy Prescriptions: INSULIN SYRINGES AND NEEDLES to inject lantus insulin once daily as directed for DM2 250.0  #100 x 3   Entered and Authorized by:   Judith Part MD   Signed by:   Judith Part MD on 07/26/2009   Method used:   Print then Give to Patient   RxID:   [id number] LANTUS 100 UNIT/ML SOLN (INSULIN GLARGINE) inject 10 units as directed each evening   #3 months x 3   Entered and Authorized by:   Judith Part MD   Signed by:   Judith Part MD on 07/26/2009   Method used:   [facility]        [facility] [facility]ail)       [address]       [place], [place]  [address]       Ph: [phone] or [phone]       Fax: [phone]   RxID:   [id number]   Current Allergies (reviewed today): ! QUINIDINE ! * SPIRIVA

## 2010-05-18 NOTE — Progress Notes (Signed)
Summary: Stop simvastatin  Phone Note From Other Clinic   Summary of Call: from cardiology-needs to change to different statin due to amiodarone interaction Initial call taken by: Judith Part MD,  March 01, 2010 1:23 PM  Follow-up for Phone Call        please call pt and tell him to hold simvastatin due to poss interaction with amiodarone  ask him to check with his insurance company to see what statin med for cholesterol is affordable -- my preference is lipitor or crestor but they may be too $$ let me know so I can get him new med  I will take simvastatin off his list  Follow-up by: Judith Part MD,  March 01, 2010 1:25 PM  Additional Follow-up for Phone Call Additional follow up Details #1::        Patient notified as instructed by telephone. Pt will contact insurance co and call Dr Milinda Antis with cholesterol meds on insurance's approved list.Rena [facility]claren [facility] LPN  March 02, 2010 9:54 AM   Pt's son Vonna Kotyk called back and said insurance co. said Prevastatin would be OK to use.[facility] Pt's son understands it may be 03/03/10 before he hears back..Please advise. Lewanda Rife LPN  March 02, 2010 12:14 PM     Additional Follow-up for Phone Call Additional follow up Details #2::    that is fine --- px written on EMR for call in  please make sure his lab appt in jan includes lipids and ast/alt 272 thanks  Follow-up by: Judith Part MD,  March 02, 2010 12:23 PM  Additional Follow-up for Phone Call Additional follow up Details #3:: Details for Additional Follow-up Action Taken: Medication phoned to [facility] as instructed. Patient notified as instructed by telephone. The lipid, ast,alt was added on to the fasting lab appt on 04/26/09 at [facility] LPN  March 02, 2010 2:33 PM   New/Updated Medications: PRAVACHOL 20 MG TABS (PRAVASTATIN SODIUM) 1 by mouth once daily Prescriptions: PRAVACHOL 20 MG TABS (PRAVASTATIN SODIUM) 1 by mouth  once daily  #30 x 11   Entered and Authorized by:   Judith Part MD   Signed by:   Lewanda Rife LPN on 16/01/9603   Method used:   Telephoned to ...       [facility] ([facility]ail)       [address]       [place], [place]  [address]       Ph: [phone] or [phone]       Fax: [phone]   RxID:   [id number]

## 2010-05-18 NOTE — Assessment & Plan Note (Signed)
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 Daleen Squibb. (Had multiple DC-CVs prior to Rhythmol)  Myoview 2006 EF 54%. no ischemia.   Carotid dopplers 8/10: 0-39% L 60-79% R  Admitted in August 201 to [facility] with tachycardia and CHF. Initially felt that it might be atrial tach but then was became irregular and was atrial fib. Echo showed EF 45% (down from 50-55%) with mild to moderately reduced RV function and diastolic dysfunction. Diuresed and rate controlled. Rhtyhmol stopped. Started on Pradaxa and plans made for outpatient DC-CV in 4 weeks.   I saw him in september and started on amiodarone. He comes today for troutine f/u. Tolerating amio and Pradaxa well. Feels like he is back in normal rhythm - energy better. Has very occasional gurgling/pain in his chest. Continues with chronic LE edema which resolves overnight. Simvas   Returns to the office with his daughter (who is anm ED nurse at [employer] Doing fairly well. Denies recurrent palpitations but says he feels sluggish when his HR is under 70. No CP, orthopnea or PND. Chronic LE edema. No bleeding on pradaxa. Insurance company switched simva to pravachol due to interaction with amio.    Current Medications (verified): 1)  Furosemide 40 Mg Tabs (Furosemide) .... Take 1 Tablet By Mouth Once Daily As Needed 2)  Gabapentin 800 Mg Tabs (Gabapentin) .... Take 1/2  Tablet By Four Times Daily 3)  Glipizide 10 Mg Tb24 (Glipizide) .... Take 1 Tablet By Mouth Once A Day 4)  Metformin Hcl 500 Mg Tabs (Metformin Hcl) .... Take 1 1/2 Tablets By Mouth Two Times A Day 5)  Avodart 0.5 Mg  Caps (Dutasteride)  .... One By Mouth Twice A Week 6)  Flomax 0.4 Mg  Cp24 (Tamsulosin Hcl) .... One By Mouth Daily 7)  Nitrofurantoin Monohyd Macro 100 Mg  Caps (Nitrofurantoin Monohyd Macro) .Marland Kitchen.. 1 Daily By Mouth 8)  Lantus 100 Unit/ml Soln (Insulin Glargine) .... Inject 35 Units As Directed Each Evening 9)  Insulin Syringes and Needles .... To Inject Lantus Insulin Once Daily As Directed For Dm2 250.0 10)  Pradaxa 150 Mg Caps (Dabigatran Etexilate Mesylate) .... Take 1 Tablet By Mouth Two Times A Day 11)  Amiodarone Hcl 200 Mg Tabs (Amiodarone Hcl) .... Take One Tablet By Mouth Twice A Day 12)  Pravachol 20 Mg Tabs (Pravastatin Sodium) .Marland Kitchen.. 1 By Mouth Once Daily  Allergies (verified): 1)  ! Quinidine 2)  ! * Spiriva  Past History:  Past Medical History: Last updated: 06/28/2008 Atrial fibrillation COPD Diabetes mellitus, type II Hyperlipidemia spinal cord injury- wheelchair bound neurogenic bladder (occ infections) prior smoker- quit 2008 carotid stenosis chronic/freq utis  urol cardiol- Wall  Past Surgical History: Last updated: 12/30/2008 Cataract extraction Orchiectomy- left, secondary to torsion Spinal stenosis- decompression (1998) Cervical spine bone graft Cath (1992) EKG (1993) CXR (1997) LE arterial study (12/1995) Cervical diskectomy (1998, 2004) Cervical cord injury- C4 Colonoscopy (01/2001) Lumbar spinal stenosis- surgery (04/2003) Abd [place]- normal (06/1995) 2D Echo (07/2004) EF 50-55% Cardiolite- ? mild ischemia (07/2004) carotid doppler (7/09) R 60-79% stenosis, and L 0-39% carotid doppler (8/10) R 60-79% adn L 0-39% without change  Family History: Last updated: 11/28/2006 Father: killed in mva Mother: deceased alzheimer's, stroke Siblings: brother- bypass surgery x 4  Social History: Last updated: 10/22/2007 Marital Status: separated (? divorced) Children: 3 Occupation: disabled long time smoker- quit 2008 no alcohol  Risk Factors: Smoking Status: current  (11/28/2006)  Review of Systems       As per HPI and past medical history; otherwise all systems negative.   Vital Signs:  Patient profile:   70 year old male Pulse rate:   40 / minute BP sitting:   110 / 58  (left arm) Cuff size:   regular  Vitals Entered By: Lysbeth Galas CMA (March 17, 2010 4:20 PM)  Physical Exam  General:  Elderly man sitting in W-C. well appearing. no resp difficulty HEENT: normal Neck: supple. scar on L. no JVD. Carotids 2+ bilat; no bruits. No lymphadenopathy or thryomegaly appreciated. Cor: PMI nondisplaced. Regular rate & rhythm. No rubs, gallops, murmur. Lungs: clear with decreased air movement and long exp phase Abdomen: soft, nontender, nondistended.  Good bowel sounds. Extremities: no cyanosis, clubbing, rash, 2+ edema bilat Neuro: alert & orientedx3, cranial nerves grossly intact. limited movement of upper extremities. plegic from waist down    Impression & Recommendations:  Problem # 1:  ATRIAL FIBRILLATION (ICD-427.31) Back in SR on amio. Will decrease dose to 200 once daily. Continue pradaxa. Will need f/u echo in next visit or two to make sure EF has gotten back to normal.   Problem # 2:  CAROTID OCCLUSIVE DISEASE (ICD-433.10) Stable. Asx. Due for yearly ultrasound.  Problem # 3:  Chet pain Atypical. Discussed possiblity of repeating stress test but he would like to defer for now. If becomes more frequent or persistent will reconsider.   Problem # 4:  HYPERLIPIDEMIA (ICD-272.4) Given vascualr disease would prefer to have him on high potency statin like atorvastatin. Will let him fisnish his current pravastatin prescription and when generic atorva is readily available we wil switch.   Other Orders: Carotid Duplex (Carotid Duplex)  Patient Instructions: 1)  Your physician recommends that you schedule a follow-up appointment in: 3 months 2)  Your physician has recommended you make the following change in your medication: Decrease  Amiodarone to 200mg  once daily  3)  Your physician has requested that you have a carotid duplex. This test is an ultrasound of the carotid arteries in your neck. It looks at blood flow through these arteries that supply the brain with blood. Allow one hour for this exam. There are no restrictions or special instructions.

## 2010-05-18 NOTE — Letter (Signed)
Summary: [facility]   Imported By: Lanelle Bal 01/13/2010 09:19:51  _____________________________________________________________________  External Attachment:    Type:   Image     Comment:   External Document

## 2010-05-23 ENCOUNTER — Encounter: Payer: Self-pay | Admitting: Internal Medicine

## 2010-05-23 ENCOUNTER — Encounter (INDEPENDENT_AMBULATORY_CARE_PROVIDER_SITE_OTHER): Payer: Medicare Other

## 2010-05-23 DIAGNOSIS — I6529 Occlusion and stenosis of unspecified carotid artery: Secondary | ICD-10-CM

## 2010-06-15 LAB — HM DIABETES EYE EXAM: HM Diabetic Eye Exam: NORMAL

## 2010-06-20 ENCOUNTER — Encounter: Payer: Self-pay | Admitting: Family Medicine

## 2010-07-25 ENCOUNTER — Other Ambulatory Visit: Payer: Self-pay | Admitting: Family Medicine

## 2010-07-25 DIAGNOSIS — E7800 Pure hypercholesterolemia, unspecified: Secondary | ICD-10-CM

## 2010-07-26 ENCOUNTER — Other Ambulatory Visit (INDEPENDENT_AMBULATORY_CARE_PROVIDER_SITE_OTHER): Payer: Medicare Other

## 2010-07-26 DIAGNOSIS — E7800 Pure hypercholesterolemia, unspecified: Secondary | ICD-10-CM

## 2010-07-26 DIAGNOSIS — E119 Type 2 diabetes mellitus without complications: Secondary | ICD-10-CM

## 2010-07-26 LAB — LIPID PANEL
Cholesterol: 140 mg/dL (ref 0–200)
LDL Cholesterol: 69 mg/dL (ref 0–99)
Total CHOL/HDL Ratio: 3
Triglycerides: 119 mg/dL (ref 0.0–149.0)
VLDL: 23.8 mg/dL (ref 0.0–40.0)

## 2010-07-26 LAB — RENAL FUNCTION PANEL
Albumin: 3.8 g/dL (ref 3.5–5.2)
BUN: 30 mg/dL — ABNORMAL HIGH (ref 6–23)
GFR: 66.68 mL/min (ref >60.00)
Glucose, Bld: 138 mg/dL — ABNORMAL HIGH (ref 70–99)
Phosphorus: 4 mg/dL (ref 2.3–4.6)

## 2010-07-28 ENCOUNTER — Other Ambulatory Visit: Payer: Self-pay | Admitting: *Deleted

## 2010-07-28 MED ORDER — INSULIN GLARGINE 100 UNIT/ML ~~LOC~~ SOLN: 10.0000 [IU] | Freq: Every day | SUBCUTANEOUS | Status: DC

## 2010-07-31 ENCOUNTER — Other Ambulatory Visit: Payer: Self-pay | Admitting: *Deleted

## 2010-07-31 NOTE — Telephone Encounter (Signed)
Rx already refilled.

## 2010-08-01 ENCOUNTER — Ambulatory Visit (INDEPENDENT_AMBULATORY_CARE_PROVIDER_SITE_OTHER): Payer: Medicare Other | Admitting: Family Medicine

## 2010-08-01 ENCOUNTER — Encounter: Payer: Self-pay | Admitting: Family Medicine

## 2010-08-01 DIAGNOSIS — E119 Type 2 diabetes mellitus without complications: Secondary | ICD-10-CM

## 2010-08-01 DIAGNOSIS — E875 Hyperkalemia: Secondary | ICD-10-CM | POA: Insufficient documentation

## 2010-08-01 DIAGNOSIS — I4891 Unspecified atrial fibrillation: Secondary | ICD-10-CM

## 2010-08-01 DIAGNOSIS — E785 Hyperlipidemia, unspecified: Secondary | ICD-10-CM

## 2010-08-01 NOTE — Assessment & Plan Note (Signed)
Again had long discussion about low glycemic diet - pt is not very motivated  Will cut bread servings to 1 pc instead of 2 Get some protien in the ams Avoid fried foods Suggested he consider DM ed at [facility] in the future

## 2010-08-01 NOTE — Assessment & Plan Note (Signed)
Doing ok on pradaxa and current meds

## 2010-08-01 NOTE — Assessment & Plan Note (Signed)
Controlled to goal on pravachol  No change in this  Rev low sat fat diet  Avoid fried foods

## 2010-08-01 NOTE — Progress Notes (Signed)
Subjective:    Patient ID: Jose Murray, male    DOB: 1940-09-06, 70 y.o.   MRN: [id number]  HPI Here for f/u of DM and hyperlipidemia (in setting of afib and chf)  Has not been doing much lately  Feels generally tired and weak-- coming on for " a good while"  Does not think he has uti  No fever Some sinus symptoms   Maybe a bit of depression  No new stress -- but is getting tired of his wheelchair  Still exercising however    AIc is down to 7.2- slowly improving Is on glipizide and metformin and also lantus opthy Sugars are running 71-116 in the ams -- really good in the ams  In the afternoon /evening runs from 160s-240   (2-3 hours after he eats )  Is eating another meal - that inc   Am - banana and 2 slices or wheat bread or cereal with milk  Mid day-- is eating sandwhich - balogne or PB -- 2pc of wheat bread (no fruit or veg) -- 1pm  Eats 3rd meal of the day 7-8 pm -- then usually eats cabbage/ collard greens/ okra/ and chicken -- fried or grilled  Has books with [employer]     K is higher than usual at 5.3 Is on pacerone Bun 30 and cr 1.2 -- water intake  Drinks one diet coke and 1/2 gallon of water per day   Chronic uti -on macrodantin - this has been a lot better   Lipids in good control  LDL is 69 currently Lab Results  Component Value Date   CHOL 140 07/26/2010   CHOL 108 01/23/2010   CHOL 125 04/06/2009   Lab Results  Component Value Date   HDL 47.20 07/26/2010   HDL 40.98* 01/23/2010   HDL 37.80* 04/06/2009   Lab Results  Component Value Date   LDLCALC 69 07/26/2010   LDLCALC 40 01/23/2010   LDLCALC 51 04/06/2009   Lab Results  Component Value Date   TRIG 119.0 07/26/2010   TRIG 191.0* 01/23/2010   TRIG 179.0* 04/06/2009   Lab Results  Component Value Date   CHOLHDL 3 07/26/2010   CHOLHDL 4 01/23/2010   CHOLHDL 3 04/06/2009    Past Medical History  Diagnosis Date  . Atrial fibrillation   . COPD (chronic obstructive pulmonary  disease)   . Diabetes mellitus   . Hyperlipidemia   . Spinal cord injury     wheelchair bound  . Neurogenic bladder     occ infections  . Carotid stenosis   . Chronic UTI    Past Surgical History  Procedure Date  . Eye surgery     cataract extraction  . Orchiectomy     left, secondary to torsion  . X-stop implantation 1998    decompression, spinal surgery 04/2003  . Cervical spine surgery     bone graft  . Cardiac catheterization 1992  . Cervical discectomy [id number]  . Spinal cord decompression     C4    reports that he quit smoking about 4 years ago. He does not have any smokeless tobacco history on file. He reports that he does not drink alcohol. His drug history not on file. family history includes Alzheimer's disease in his mother and Stroke in his mother. Allergies  Allergen Reactions  . Quinidine       Review of Systems  Constitutional: Positive for fatigue. Negative for fever, activity change and appetite change.  Eyes: Negative for pain and visual disturbance.  Respiratory: Negative for cough and shortness of breath.   Cardiovascular: Positive for leg swelling. Negative for chest pain and palpitations.  Gastrointestinal: Negative for abdominal pain, diarrhea and constipation.  Genitourinary: Negative for dysuria and urgency.  Musculoskeletal: Positive for gait problem. Negative for myalgias.  Skin: Negative.   Neurological: Positive for weakness. Negative for numbness and headaches.  Hematological: Negative for adenopathy. Bruises/bleeds easily.  Psychiatric/Behavioral: Positive for dysphoric mood. Negative for suicidal ideas. The patient is not nervous/anxious.        Objective:   Physical Exam  Constitutional: He appears well-developed and well-nourished.       Wheelchair bound,overweight   HENT:  Head: Normocephalic and atraumatic.  Eyes: Conjunctivae and EOM are normal. Pupils are equal, round, and reactive to light.  Neck: Normal range of motion.  Neck supple. No JVD present. Carotid bruit is not present. Erythema present. No thyromegaly present.  Cardiovascular: Normal rate, regular rhythm and normal heart sounds.   Pulmonary/Chest: Effort normal and breath sounds normal. He has no wheezes. He exhibits no tenderness.  Abdominal: Soft. Bowel sounds are normal. He exhibits no distension. There is no tenderness.  Musculoskeletal: He exhibits edema. He exhibits no tenderness.       More pitting edema today- 1-2 plus below knee Some rubor  Lymphadenopathy:    He has no cervical adenopathy.  Neurological: He is alert. He has normal reflexes. He exhibits abnormal muscle tone.  Skin: Skin is warm and dry. No rash noted. There is erythema.       Mild healing sunburn on top of L foot  Psychiatric: He has a normal mood and affect.       Seems lethargic today          Assessment & Plan:

## 2010-08-01 NOTE — Assessment & Plan Note (Signed)
Disc banana intake which he will cut down  Also disc inc in water in light of slt elevated bun and cr Need to watch this closely as well  If renal insuff-- may need to change metformin

## 2010-08-01 NOTE — Patient Instructions (Signed)
Swap out the banana for cereal with milk in the ams  Sandwiches need one pc of bread (not 2)  Stick with grilled instead of fried  Keep exercising  Wear your sunscreen  Schedule non fasting labs in 2 weeks -- for kidney function and potassium  Use lasix tomorrow for swelling -- you are more swollen than usual  Keep watching sugars

## 2010-08-11 ENCOUNTER — Other Ambulatory Visit: Payer: Self-pay | Admitting: Family Medicine

## 2010-08-15 ENCOUNTER — Other Ambulatory Visit (INDEPENDENT_AMBULATORY_CARE_PROVIDER_SITE_OTHER): Payer: Medicare Other | Admitting: Family Medicine

## 2010-08-15 DIAGNOSIS — E875 Hyperkalemia: Secondary | ICD-10-CM

## 2010-08-15 DIAGNOSIS — E119 Type 2 diabetes mellitus without complications: Secondary | ICD-10-CM

## 2010-08-16 LAB — RENAL FUNCTION PANEL
Albumin: 3.4 g/dL — ABNORMAL LOW (ref 3.5–5.2)
BUN: 23 mg/dL (ref 6–23)
Creatinine, Ser: 1.1 mg/dL (ref 0.4–1.5)
Glucose, Bld: 233 mg/dL — ABNORMAL HIGH (ref 70–99)
Phosphorus: 3.7 mg/dL (ref 2.3–4.6)

## 2010-08-29 NOTE — Assessment & Plan Note (Signed)
[facility] OFFICE NOTE   NAME:Jose Murray, Jose Murray                       MRN:          [id number]  DATE:09/17/2006                            DOB:          10-Nov-1940    Jose Murray returns today for further management of the following issues:  1. History of atrial flutter, status post ablation in September 1998,      without recurrence.  2. History of paroxysmal atrial fibrillation, well controlled on      Rythmol 225 mg p.o. b.i.d., with no recurrences.  He had none this      past year as well.  3. Chronic low blood pressure.  4. History of hyperlipidemia.  5. History of type 2 diabetes.  6. Nonobstructive carotid disease, with a right internal carotid      artery bruit.  He is due for follow-up carotids.  He is      asymptomatic.  7. Tobacco use.   Jose Murray has no complaints.  He does have occasional weak spells which  may be related to his blood pressure.  He is really not taking much  other than Flomax and p.r.n. Lasix that could affect it.  I think a lot  of this is due to immobility.   MEDICATIONS:  1. Glucophage 500 b.i.d.  2. Glucotrol XL 10 mg a day.  3. Rythmol 225 mg b.i.d.  4. Neurontin 800 mg b.i.d.  5. Vitamin E.  6. Enteric coated aspirin 325 mg a day.  7. Aleve at bedtime.  8. Lipitor 10 mg a day.  9. Flomax 0.4 mg a day.  10.Avodart 0.5 daily.  11.Nitrofurantoin 100 mg daily.   PHYSICAL EXAMINATION:  VITAL SIGNS:  His blood pressure was 88/50  sitting in a wheelchair, his pulse is 77 and regular.  EKG shows sinus  rhythm, normal PR, QRS, and QTc interval.  He has a right bundle, with a  left anterior fascicular block.  He was not weighed.  SKIN:  Ruddy and slightly plethoric face.  PERRLA.  Extraocular  movements intact.  Sclera clear.  Facial symmetry is normal.  NECK:  Carotids are full.  He has a soft right carotid bruit.  Thyroid  is not enlarged.  Trachea is midline.  LUNGS:   Reveal inspiratory and expiratory rhonchi.  HEART:  Reveals a soft S1, S2, with a poorly appreciated PMI.  ABDOMEN:  Soft, with good bowel sounds.  EXTREMITIES:  Reveal 1+ pitting edema at the ankles.  Pulses are  present.  There is no sign of DVT.  He has diffuse atrophy.   ASSESSMENT AND PLAN:  Jose Murray is doing well.  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  His  low blood pressure is an issue, and I have advised him to take the Lasix  as little as possible.  He has bronchitis, with diffuse abnormal breath  sounds.  I have advised him to follow up with a chest x-ray with Dr.  Milinda Antis, which he says she has planned.  We will  also schedule carotid  Dopplers here at the [place] office.  Assuming the carotids are  stable, I will see him back in a year.  I have made no change in his  medical program.     Jesse Sans. Daleen Squibb, MD, [facility]  Electronically Signed    TCW/MedQ  DD: 09/17/2006  DT: 09/17/2006  Job #: [id number]   cc:   Marne A. Milinda Antis, MD

## 2010-08-29 NOTE — Assessment & Plan Note (Signed)
[facility] OFFICE NOTE   NAME:Jodoin, SHREYAN HINZ                       MRN:          [id number]  DATE:10/22/2007                            DOB:          [birth year]    Mr. Rishel returns today for followup.  He has quit smoking all of this  year !.  He says it is still difficult.   He has had no tachy palpitations, presyncope, or syncope.  He seems to  be very compliant with his medications.  His blood sugars were up and  down, but he is seeing Dr. Milinda Antis next week.  He has had no symptoms of  TIAs or stroke.   We did preliminary Dopplers today of his carotids and this shows no  significant change with a 60%-79% right internal carotid artery stenosis  with the left internal being stable at 0%-39%.  There was antegrade flow  in both vertebrals.  The final report is pending.   MEDICATIONS:  1. Glucophage 500 mg p.o. b.i.d.  2. Glucotrol XL 10 mg a day.  3. Rythmol 225 mg b.i.d.  4. Neurontin 800 mg p.o. b.i.d.  5. Vitamin E 800 units a day.  6. Enteric-coated aspirin 325 mg daily at bedtime.  7. Lipitor 10 mg a day.  8. Flomax 0.4 mg a day.  9. Avodart 0.5 mg daily.  10.Nitrofurantoin 100 mg p.o. daily.   PHYSICAL EXAMINATION:  GENERAL:  He is in no acute distress.  He is  sitting in a wheelchair, which is baseline.  VITAL SIGNS:  Blood pressure is actually a little bit better, but it  tends to run low at 102/62.  Last year, it was 88/50, pulse is 64 and  regular.  HEENT:  Unchanged from previous exams.  NECK:  Carotids upstrokes were equal bilaterally with a very faint right  carotid bruit.  Thyroid is not enlarged.  Trachea is midline.  Neck is  supple.  LUNGS:  No rhonchi today.  No rales.  HEART:  Regular rate and rhythm.  No gallop.  ABDOMEN:  Soft, good bowel sounds.  No midline bruit was appreciated.  EXTREMITIES:  No cyanosis, clubbing, but he does have 1+ to 2+ edema.  This is from immobility.  His  pulses were poorly appreciated, but his  feet are warm.   His EKG shows sinus rhythm with an incomplete right bundle and a left  anterior fascicular block, which is stable.  He does have some ST-  segment changes in the anterolateral leads, which are slightly more  prominent.   ASSESSMENT AND PLAN:  I am delighted that Mr. Jeschke has quit smoking.  He is stable from our standpoint.  We will plan on seeing him back in a  year.  He will follow up with Dr. Milinda Antis concerning his lipid management  and his diabetes control.     Thomas C. Daleen Squibb, MD, [facility]  Electronically Signed    TCW/MedQ  DD: 10/22/2007  DT: 10/22/2007  Job #: [id number]   cc:   Marne A. Milinda Antis, MD

## 2010-09-01 NOTE — Op Note (Signed)
NAME:  Jose Murray, Jose Murray                          ACCOUNT NO.:  [id number]   MEDICAL RECORD NO.:  [id number]                   PATIENT TYPE:  INP   LOCATION:  3002                                 FACILITY:  [facility]   PHYSICIAN:  Hilda Lias, M.D.                DATE OF BIRTH:  [birth year]   DATE OF PROCEDURE:  04/27/2003  DATE OF DISCHARGE:                                 OPERATIVE REPORT   PREOPERATIVE DIAGNOSIS:  Lumbar stenosis with neurogenic claudication.   POSTOPERATIVE DIAGNOSIS:  Lumbar stenosis with neurogenic claudication.   OPERATION PERFORMED:  Bilateral 2, 3, 4, 5 laminectomies.  Decompression of  thecal sac, bilateral foraminotomies.   SURGEON:  Hilda Lias, M.D.   ASSISTANT:  Stefani Dama, M.D.   ANESTHESIA:  General.   INDICATIONS FOR PROCEDURE:  The patient was admitted because of back pain  with radiation down to both legs.  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  X-ray showed a severe case of  stenosis at the level of 2-3, 3-4 and borderline between 4-5.  Surgery was  advised.  The risks were explained in the history and physical.   DESCRIPTION OF PROCEDURE:  The patient was taken to the operating room and  after intubation, midline incision from L2 down to L5 was made.  Muscle and  fascia were retracted laterally.  Then with the Stille rongeur, we removed  the spinous process of 5, 4, 3 and 2.  With the Leksell as well as the drill  and the Kerrison punch, we did bilateral laminectomy with removal of thick  yellow ligament.  We went laterally to decompress every single nerve from L2  down to L5-S1.  Having good decompression, x-rays showed that indeed the  area was totally decompressed.  From then on, Valsalva maneuver was  negative.  The area was irrigated.  Fentanyl and Depo-Medrol were left in  the epidural space and the wound was closed with Vicryl and Steri-Strip.      Hilda Lias, M.D.    EB/MEDQ  D:  04/27/2003  T:  04/27/2003  Job:  [id number]

## 2010-09-01 NOTE — Discharge Summary (Signed)
NAME:  Jose Murray, Jose Murray                          ACCOUNT NO.:  [id number]   MEDICAL RECORD NO.:  [id number]                   PATIENT TYPE:  IPS   LOCATION:  4010                                 FACILITY:  [facility]   PHYSICIAN:  Ellwood Dense, M.D.                DATE OF BIRTH:  [birth year]   DATE OF ADMISSION:  11/02/2002  DATE OF DISCHARGE:  12/02/2002                                 DISCHARGE SUMMARY   DISCHARGE DIAGNOSES:  1. C4 corpectomy/decompression.  2. History of diabetes mellitus.  3. Urinary retention.  4. Neuropathy.  5. History of atrial fibrillation.  6. Chronic low back pain.  7. Orthostatic hypotension.   HISTORY OF PRESENT ILLNESS:  The patient is a 70 year old white male with  past medical history of severe cervical stenosis and progressive lower  extremity weakness status post cervical laminectomy in 1998, transferred to  [facility] from [place] for further evaluation of quadriparesis after a  fall hitting chin. Cervical MRI revealed decompression of the spinal cord.  The patient underwent a C4 corpectomy decompression of the spinal cord and  bone graft by Dr. Jeral Fruit on October 27, 2002. Postoperative complications  significant for PAF, VT, and urinary retention. PT reported at this time  indicated patient is bed mobility moderate assist, transfers 2+ total  assist, ambulate approximately five feet with platform rolling walker.   PAST MEDICAL HISTORY:  Significant for diabetes, PAF as above.   PAST SURGICAL HISTORY:  Significant for cardioversion and ablation 2004.  1. Orchiectomy.   PRIMARY CARE Andriana Casa:  Elsworth Soho, M.D.   CARDIOLOGY:  Jesse Sans. Wall, M.D., of [place].   SOCIAL HISTORY:  The patient lives with wife in one level home [place]  [place], [place] two steps to entry, independent prior to  admission. Family able to assist. He quit smoking several years ago. Denies  any alcohol. He is fairly active.   REVIEW OF SYSTEMS:   Significant for back pain and lower extremity weakness.   ALLERGIES:  CLONIDINE.   FAMILY HISTORY:  Noncontributory.   MEDICATIONS PRIOR TO ADMISSION:  1. Glucophage 1,000 mg b.i.d.  2. Aspirin one tablet daily.  3. Actos 30 mg daily.  4. Glucotrol XL 10 mg daily.  5. Betapace 60 mg p.o. b.i.d.   HOSPITAL COURSE:  [name] was admitted to [facility] rehab  department on November 02, 2002 for comprehensive patient rehabilitation to  receive more than three hours of therapy daily. Hospital course was  significant for the following:   Problem 1.  Status post C4 corpectomy and decompression secondary to severe  stenosis. Overall, the patient made slow progress during his 30-day stay in  rehab. He remained on Lovenox 40 mg subcu daily for DVT prophylaxis his  entire stay in rehab. The patient still at time of discharge had significant  lower extremity and right upper extremity weakness, especially on the  right.  The patient also has significant right foot drop. He was discharged on a min  assist to mod assist level and with family to assist with care. The patient  occasionally complains of neck pain. He received K pad, OxyContin, and  oxycodone as needed. On December 01, 2002, he received a _________ for his  right lower extremity for his foot drop. The patient overall was able to  tolerate therapy very well, but as stated above, he progressed very slowly.  He was ambulating 50% at 1-70 feet using EVA walker requiring  2+ total  assist and transfers min to max assist. He wore his cervical collar at all  times. Repeat cervical x-rays were performed on December 14, 2002 which  revealed near anatomic alignment following fusion, no listhesis. The patient  was also followed as needed by neurology.  Problem 2.  Urinary retention. The patient had difficulty voiding while in  rehab. He was started on Urecholine as well as Flomax, and dosage was  adjusted as needed. The patient finally began to  void after two weeks of  medication. Unfortunately, medications had to be discontinued due to  significant symptomatic orthostasis. As the Flomax and Urecholine was  discontinued, voiding did decrease. The patient was requiring in and out  catheterizations as needed, and PVRs were running in the upper 400s. The  patient still occasionally voids but still has high PVRs. The patient's  family was trained and educated how to perform in and catheterization as  needed. The patient is to follow up with his primary care doctor regarding  urinary retention and if possible refer patient to urologist in the near  future.  Problem 3.  Orthostatic hypotension. As mentioned above, the patient had  significant orthostasis while performing therapies. The patient initially  was placed on an abdominal binder as well as TED hose. Nevertheless, the  patient still continues to be symptomatic. Therefore, Urecholine and Flomax  had to be discontinued, and the patient's symptoms did resolve. The patient  also was consulted by cardiology for any suggestions, and they recommended  to discontinue Urecholine and Flomax as we have already done. The patient's  blood pressure remained fairly low, but he was not as symptomatic once the  medications were discontinued.  Problem 4.  History of atrial fibrillation. The patient was followed  occasionally by cardiology, rate fairly controlled, remained on Rhythmol 200  mg b.i.d. Toprol-XL was initially added on August 3 but had to be  discontinued due to decrease in blood pressure. Rate managed in good control  at time of discharge. The patient is to follow up with Dr. Daleen Squibb within three  to four weeks.  Problem 5.  History of diabetes mellitus. CBCs were under good control while  in rehab. He continued to be on Glucophage 1,000 mg b.i.d. as well as Actos 30 mg daily and Glucotrol 10 mg daily. No adjustments necessary in diabetic  medication. He remained on a carbohydrate  modified diet.  Problem 6.  Back pain. The patient complained occasionally of low back  during the first two weeks of stay despite being on OxyContin and oxycodone.  X-rays were performed on the back which revealed degenerative disk disease.  He was started on Vioxx 25 mg, and this was increased to 50 mg on November 09, 2002. The patient also had kinesiotape performed in his lower back as  needed. Low back pain did improve finally while he was in the rehab.   Besides occasional constipation,  no other major medical issues occurred  while patient was in rehab. He received Dulcolax suppository Senokot S as  needed.   Latest urine culture performed on November 25, 2002 with greater than 100,000  colonies, multispecies present with no pathogen isolated. Latest hemoglobin  was 12.5, hematocrit 36.8, white blood cell count 4.0, platelet count 275.  Latest sodium performed on November 26, 2002 was 137, potassium 4.5, chloride  101, CO2 29, glucose 86, BUN 22, creatinine 0.7, calcium 9.3. AST 15, ALT  18.   All vitals were stable. Blood pressure 124/72, respiratory rate 20, pulse  80, temperature 98.0. CBGs ran from 131 to 122. PT report indicated that  patient is walking 2+ total assist 50-170 feet using EVA walker, +1 for  safety, can transfer min to mod assist, and stand to pivot min to mod  assist, perform bed mobility supervision to max assist. Overall, the patient  has demonstrated improvement in areas of mobility and requires supervision  to max assist for function and movement. He continues to be limited by his  low back pain and drops in blood pressure, decreased functional use of his  bilateral upper extremities and weak lower extremities. Family has completed  family education in preparation for discharge to home. The family verbally  understands the need for patient to have 24-hour supervision and assistance  for safety. The patient has met all long term goals from a TR standpoint.  The  patient is able to complete simple tasks using bilateral lower  extremities and bilateral upper extremities from wheelchair level with  supervision. The patient requires min assist for bilateral upper extremity  for moderate tasks from wheelchair level. The patient has made some progress  with bilateral extremities but continues to need assistance for functional  tasks. Overall, patient is discharged at a minimal assist supervision level  from a rehab standpoint. The patient was discharged home with the rest of  the physical exam intact. Still has significant weakness in upper  extremities and lower extremities with the left lower extremity foot drop  and he was discharged home with his family.   DISCHARGE MEDICATIONS:  1. Glucophage 1,000 mg p.o. b.i.d.  2. Actos 30 mg p.o. q.d.  3. Glucotrol 10 mg p.o. q.d.  4. Neurontin 300 mg p.o. t.i.d.  5. Rythmol 225 mg p.o. b.i.d. 6. Vitamin E 8 units p.o. q.d.  7. Vioxx 25 mg p.o. q.d.  8. OxyContin 20 mg follow taper.  9. Oxycodone 5 to 10 p.o. q.4-6h. as needed.  10.      Ambien 5 to 10 mg p.o. q.h.s. p.r.n.   PAIN MANAGEMENT:  Oxycodone, OxyContin, Tylenol, and Neurontin.   DISCHARGE INSTRUCTIONS:  No driving or drinking alcohol. Use wheelchair. No  smoking. Use walker. He is to have no concentrated sweets. Check CBGs at  least daily and record results and time. He is wear collar at all times. He  will perform in and out catheter of his bladder as needed. He will follow at  [facility] for PT and OT. He will call for appointment. He is to follow  up with Dr. Jeral Fruit in two weeks. Follow up with  Dr. Thomasena Edis in the office in three to four weeks. Call for appointment. He  is to follow with his primary care Elisia Stepp in four to six weeks to refer  him to a neurologist. He is also to follow up with Dr. Ellwood Dense  September 29 at 1 p.m.  Junie Bame, P.A.                       Ellwood Dense, M.D.    LH/MEDQ  D:   12/02/2002  T:  12/03/2002  Job:  [id number]   cc:   Hilda Lias, M.D.  [address]  [place], [place] [address]  Fax: [phone]   L. Lupe Carney, M.D.  [address] [place]  [place] [address]  Fax: [phone]   Jesse Sans. Wall, M.D.

## 2010-09-01 NOTE — Assessment & Plan Note (Signed)
Mr. Raybourn returns to clinic today for followup evaluation.  He is receiving  home health occupational therapy at his home in [place], [place].  He reports that he does exercise on a regular basis and continues to use his  Thera-Bands along with an Exercycle.  He reports that he obtains assistance  for ambulation from his family using contact guard assistance.  He does have  an aid approximately eight hours per day into the home.   His blood sugar has been in the 100-127 range, according to his family.   The patient did undergo an MRI scan of his lumbar spine today with Dr.  Jeral Fruit.  That is to evaluate lumbar degenerative disk disease with the  possibility of future surgery.  He is due for followup with Dr. Jeral Fruit on  March 25, 2003.   The patient himself is recovering well from his cervical decompression and  corpectomy done in July of 2004.  He does require assistance with bathing,  but generally can don loose shirts.  He is able to generally dress himself,  although he needs some assistance.  He does require shoulder assistance and  stabilization using a sling for self-feeding.   MEDICATIONS:  1. Glucophage 1000 mg b.i.d.  2. Actos 30 mg daily.  3. Glucotrol XL 10 mg daily.  4. Rhythmol 225 mg b.i.d.  5. Neurontin 300 mg t.i.d.  6. Vitamin E daily.  7. Lexapro 10 mg daily.  8. Aspirin 325 mg daily.  9. Restoril 15 mg q.h.s.  10.      Aleve b.i.d.   PHYSICAL EXAMINATION:  GENERAL APPEARANCE:  A well-appearing adult male.  VITAL SIGNS:  Blood pressure 108/56 with a pulse of 76 and O2 saturation 95%  on room air.  NEUROLOGIC:  He has strength of 3+/5 in the right hand and 4-/5 in the left  hand.  Otherwise upper extremity strength was 4-/5 bilaterally.  Sensation  was decreased in the bilateral upper extremities.  The lower extremity exam  showed 4/5 strength with the exception of knee extension on the left, which  was 5-/5.   IMPRESSION:  1. Status post C4  corpectomy/decompression on October 27, 2002.  2. 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  3. History of diabetes mellitus.  4. Urinary retention, improved.  5. History of atrial fibrillation.  6. Chronic low back pain.  7. Orthostatic hypotension, improved.   In the clinic today I did give the patient a prescription for a right ankle  foot arthrosis to be filled through [facility].  I have set him up with an  appointment today later in the afternoon for casting as this would improve  the ease of delivery.  He does live in [place], [place], and it  is difficult for him to get into town here for the casting.   I will plan on seeing the patient in followup in approximately three months'  time.  He continues to make excellent progress overall and has been  encouraged to continue to make progress 12-18 months after his surgery.  Dr.  Jeral Fruit continues to evaluate him for possible future lumbar surgery.   I will plan on seeing the patient in followup in approximately three months'  time.      Ellwood Dense, M.D.   DC/MedQ  D:  03/17/2003 16:00:58  T:  03/17/2003 16:22:42  Job #:  [id number]

## 2010-09-01 NOTE — Procedures (Signed)
[facility]  Patient:    Jose Murray, Jose Murray Visit Number: [id number] MRN: [id number]          Service Type: Attending:  Verlin Grills, M.D. Dictated by:   Verlin Grills, M.D. Proc. Date: 02/10/01   CC:         Dr. Lupe Carney                           Procedure Report  PROCEDURE:  Colonoscopy.  REFERRING PHYSICIAN:  Dr. Lupe Carney.  INDICATIONS FOR PROCEDURE:  The patient (date of birth, 03/18/1941) is a 70 year old male.  The patient submitted stool for Hemoccult testing to Dr. Viann Shove office; two of six cards were positive for blood.  I discussed with the patient the complications associated with colonoscopy and polypectomy including a 15 per 1000 risk of bleeding and 4 per 1000 risk of colon perforation requiring surgical repair.  The patient has signed the operative permit.  ENDOSCOPIST:  Verlin Grills, M.D.  PREMEDICATION:  Versed 5 mg and Demerol 50 mg.  ENDOSCOPE:  Olympus pediatric colonoscope.  DESCRIPTION OF PROCEDURE:  After obtaining informed consent, the patient was placed in the left lateral decubitus position.  I administered intravenous Versed and intravenous Demerol to achieve conscious sedation for the procedure.  The patients blood pressure, oxygen saturation, and cardiac rhythm were monitored throughout the procedure and documented in the medical record.  Anal inspection was normal.  Digital rectal exam revealed a non-nodular prostate.  The Olympus pediatric video colonoscope was introduced into the rectum, and easily advanced to the cecum with the patient in the left lateral decubitus position.  Colonic preparation for the exam today was excellent.  Rectum normal.  Sigmoid colon and descending colon normal.  Splenic flexure normal.  Transverse colon normal.  Hepatic flexure normal.  Ascending colon normal.  Cecum and ileocecal valve normal.  ASSESSMENT:  Normal  proctocolonoscopy to the cecum. Dictated by:   Verlin Grills, M.D. Attending:  Verlin Grills, M.D. DD:  02/10/01 TD:  02/11/01 Job: [id number]

## 2010-09-01 NOTE — Discharge Summary (Signed)
   NAME:  Jose Murray, Jose Murray                          ACCOUNT NO.:  [id number]   MEDICAL RECORD NO.:  [id number]                   PATIENT TYPE:  INP   LOCATION:  3736                                 FACILITY:  [facility]   PHYSICIAN:  Hilda Lias, M.D.                DATE OF BIRTH:  December 14, 1940   DATE OF ADMISSION:  10/12/2002  DATE OF DISCHARGE:  11/02/2002                                 DISCHARGE SUMMARY   ADMISSION DIAGNOSIS:  Spinal cord injury with cervical stenosis.   FINAL DIAGNOSIS:  Spinal cord injury with cervical stenosis.   CLINICAL HISTORY:  [name] is a gentleman who was admitted by Dr. Tiburcio Pea  because of sudden onset of weakness in all four extremities secondary to  trauma.  The patient was found he had a spinal cord injury.  He has weakness  of the arm and both lower extremities.  MRI was obtained and showed that he  has a cervical stenosis with damage to his spinal cord.  The patient was  admitted to the intensive care unit.   LABORATORY DATA:  Normal.   HOSPITAL COURSE:  The patient was kept in the intensive care unit and he was  a steroid protocol.  The patient reached a plateau and in view of no  improvement, we decided to proceed with cervical decompression which was  done on October 27, 2002.  The patient had C4 corpectomy with removal of the  disk at the level of 3-4 and 4-5, followed by a fusion.  After that, the  patient slightly improved.  He was complaining of some neck pain and pain  into the shoulder.  Because we knew that [name] was going to continue  rehabilitation, he was transferred to the rehab units on November 02, 2002.   CONDITION ON DISCHARGE:  Mild improvement.   MEDICATIONS:  He is to continue taking the same medications that he was at  [facility].   DIET:  Same diet.   ACTIVITY:  Up to the rehab unit.   FOLLOWUP:  I will continue to follow [name] while he is in the rehab  unit, as well as once he has been discharged from the  hospital.                                                Hilda Lias, M.D.    EB/MEDQ  D:  12/24/2002  T:  12/26/2002  Job:  [id number]

## 2010-09-01 NOTE — H&P (Signed)
NAME:  HARMAN, LANGHANS                          ACCOUNT NO.:  [id number]   MEDICAL RECORD NO.:  [id number]                   PATIENT TYPE:  INP   LOCATION:  2899                                 FACILITY:  [facility]   PHYSICIAN:  Hilda Lias, M.D.                DATE OF BIRTH:  December 01, 1940   DATE OF ADMISSION:  04/27/2003  DATE OF DISCHARGE:                                HISTORY & PHYSICAL   Mr. Graybeal is a gentleman who was admitted by me because of back pain with  radiation to both legs, associated with weakness.  This problem began for  several months and is getting worse to the point that he noted he has  difficulty walking.  In the past, in 1998, he underwent a __________  decompression because of cervical myelopathy.  Now the x-ray of the lumbar  spine showed stenosis which is worse at the level of 2-3, 3-4.  Because of  the findings, the patient wanted to proceed with surgery.   PAST MEDICAL HISTORY:  Anterior cervical diskectomy in 1998 secondary to  myelopathy.   SOCIAL HISTORY:  He smokes.  He does not drink.   ALLERGIES:  QUINIDINE.   FAMILY HISTORY:  Unremarkable.   REVIEW OF SYSTEMS:  Patient has a history of diabetes.   PHYSICAL EXAMINATION:  GENERAL:  When the patient came to my office, he was  walking with a sure step.  NECK:  There is a scar from previous orthopedic surgery.  He has decreased  flexion of the cervical spine.  LUNGS:  Some mild rhonchi bilaterally.  HEART:  Heart sounds normal.  ABDOMEN:  Normal.  EXTREMITIES:  Normal pulses.  NEURO:  Mental status normal.  Cranial nerves normal.  Strength:  Patient  has weakness with the iliopsoas and the quadriceps.  Some mild weakness on  dorsiflexion.  Reflexes:  He has hyperreflexia secondary to his old  myelopathy.  Sensation normal.   A lumbar spine x-ray and MRI shows that he has a severe case of stenosis,  mostly below L2-3 and L3-4.   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   RECOMMENDATIONS:  We will encourage that he be admitted for decompressive  laminectomy.  The procedure was explained to him and the risks of infection,  CSF leak, the need for further surgery which would require fusion, no  improvement whatsoever because of the previous history of myelopathy, and  damage to the __________.                                                Hilda Lias, M.D.   EB/MEDQ  D:  04/27/2003  T:  04/27/2003  Job:  [id number]

## 2010-09-01 NOTE — Op Note (Signed)
NAME:  Jose Murray, Jose Murray                          ACCOUNT NO.:  [id number]   MEDICAL RECORD NO.:  [id number]                   PATIENT TYPE:  INP   LOCATION:  3005                                 FACILITY:  [facility]   PHYSICIAN:  Hilda Lias, M.D.                DATE OF BIRTH:  1940-10-30   DATE OF PROCEDURE:  10/27/2002  DATE OF DISCHARGE:                                 OPERATIVE REPORT   PREOPERATIVE DIAGNOSES:  1. 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  2. Status post C5 through C7 diskectomy and fusion.   POSTOPERATIVE DIAGNOSES:  1. 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  2. Status post C5 through C7 diskectomy and fusion.   PROCEDURES:  1. C4 corpectomy.  2. Removal of disk at C3-4, C4-5.  3. Decompression of the spinal cord.  4. Bone graft from C3 to C5, followed by a plate.  5. Microscope.   SURGEON:  Hilda Lias, M.D.   ASSISTANT:  Coletta Memos, M.D.   CLINICAL HISTORY:  The patient was admitted approximately a week after he  had a hyperextension injury to the neck.  X-rays show a herniated disk with  compression at the level of 3-4, 4-5.  Surgery was advised.   PROCEDURE:  The patient was taken to the OR and after intubation, the left  side of the neck was prepped with Betadine.  Following the previous scar,  the incision was made through the skin and subcutaneous tissue until we  found the spine.  The patient had a great deal of scar tissue from previous  surgery.  We were able to see the previous plate, which we knew was at the  top of C5.  X-rays showed that indeed we were at the level of 4-5.  Then we  removed the disk at 4-5 and later on at the level of 3-4.  With the drill we  did a total gross corpectomy.  We brought the microscope into the area, and  we were able to remove the rest of the body of C4 with removal of the yellow  ligament, which was also calcified and adherent.  Decompression superior of  3 and below of  5 was accomplished.  At the end we have good decompression of  the spinal cord.  There was minimal swelling of the spinal cord.  Having  done this and having good decompression, we were able to introduce iliac  crest bone graft between C3 down to C5.  Using a plate we were able to put a  plate on top of C5 and above C3.  Lateral C-spine showed good position of  the bone graft.  From then on, the area was irrigated.  Investigation of the  esophagus and carotid was negative.  Then the wound was closed with Vicryl.  A Jackson-Pratt drain was used.  Hilda Lias, M.D.   EB/MEDQ  D:  10/27/2002  T:  10/28/2002  Job:  [id number]

## 2010-09-01 NOTE — Consult Note (Signed)
NAME:  Jose Murray, Jose Murray                          ACCOUNT NO.:  [id number]   MEDICAL RECORD NO.:  [id number]                   PATIENT TYPE:  INP   LOCATION:  3111                                 FACILITY:  [facility]   PHYSICIAN:  Doylene Canning. Ladona Ridgel, M.D.               DATE OF BIRTH:  [birth year]   DATE OF CONSULTATION:  DATE OF DISCHARGE:                                   CONSULTATION   REASON FOR CONSULTATION:  The consultation is requested by Dr. Colon Branch  for evaluation of atrial fibrillation.   HISTORY OF PRESENT ILLNESS:  The patient is a very pleasant 70 year old male  with a history of atrial flutter, status post flutter ablation, a history of  paroxysmal atrial fibrillation, diabetes, and cervical spine stenosis with  associated weakness.  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  The patient, in the MRI  scanner today, had a brief episode of what appears to be paroxysmal atrial  fibrillation; he was asymptomatic with this.  He denies any recent history  of syncope or near syncope.  He denies chest pain or shortness of breath.   PAST MEDICAL HISTORY:  Notable for diabetes as well as atrial fibrillation.   PAST SURGICAL HISTORY:  1. He has a history of an orchiectomy.  2. In addition, he had a diskectomy in February of '98.   SOCIAL HISTORY:  The patient denies tobacco or ethanol use.  He is married  and has three children.  He is retired from [employer].  He has a history of tobacco abuse but quit smoking 21  months ago.   REVIEW OF SYSTEMS:  Notable for generalized weakness.   PHYSICAL EXAMINATION:  GENERAL:  He is a pleasant 70 year old obese man in  no distress.  VITAL SIGNS:  Blood pressure is 114/50, the pulse is 78 and regular,  respirations are 18.  HEENT EXAM:  Normocephalic and atraumatic.  The pupils are equal and round.  The oropharynx is moist.  The sclerae are anicteric.  NECK:  Reveals no jugular venous distention.  There is no thyromegaly.  The  carotids are 2+ and symmetric.  LUNGS:  Clear bilaterally to auscultation.  CARDIOVASCULAR EXAM:  Reveals a regular rate and rhythm with a normal S1 and  S2.  ABDOMINAL EXAM:  Soft and nontender.  There is no organomegaly.  EXTREMITIES:  Demonstrate no cyanosis, clubbing, or edema.  NEUROLOGICAL:  The patient is alert and oriented times three.  His cranial  nerves II-XII are grossly intact.  Strength is as noted by Dr. Phoebe Perch.   LABORATORY DATA:  EKG demonstrates a normal sinus rhythm.   IMPRESSION:  1. Paroxysmal atrial fibrillation.  2. History of atrial flutter, status post ablation.  3. Diabetes.  4. Cervical stenosis with recent cervical cord compression after a fall.   DISCUSSION/RECOMMENDATIONS:  Continued Betapace  at 80 mg twice per day.  If  he has breakthroughs of atrial arrhythmias, then IV Lopressor would be  warranted.  Because of his pending possible cervical spine surgery, we will  not recommend anticoagulation at the present time.                                               Doylene Canning. Ladona Ridgel, M.D.    GWT/MEDQ  D:  10/13/2002  T:  10/13/2002  Job:  [id number]   cc:   Thomas C. Wall, M.D.

## 2010-09-14 ENCOUNTER — Other Ambulatory Visit: Payer: Self-pay | Admitting: Emergency Medicine

## 2010-09-14 MED ORDER — DABIGATRAN ETEXILATE MESYLATE 150 MG PO CAPS: 150.0000 mg | ORAL_CAPSULE | Freq: Two times a day (BID) | ORAL | Status: DC

## 2010-10-24 ENCOUNTER — Other Ambulatory Visit: Payer: Self-pay | Admitting: *Deleted

## 2010-10-24 MED ORDER — AMIODARONE HCL 200 MG PO TABS: 200.0000 mg | ORAL_TABLET | Freq: Every day | ORAL | Status: DC

## 2010-11-13 ENCOUNTER — Other Ambulatory Visit: Payer: Self-pay | Admitting: *Deleted

## 2010-11-13 MED ORDER — GABAPENTIN 800 MG PO TABS: ORAL_TABLET | ORAL | Status: DC

## 2010-11-13 NOTE — Telephone Encounter (Signed)
Will send electronically.

## 2010-12-07 ENCOUNTER — Encounter: Payer: Self-pay | Admitting: Cardiovascular Disease

## 2010-12-07 ENCOUNTER — Ambulatory Visit (INDEPENDENT_AMBULATORY_CARE_PROVIDER_SITE_OTHER): Payer: Medicare Other | Admitting: Cardiovascular Disease

## 2010-12-07 DIAGNOSIS — E785 Hyperlipidemia, unspecified: Secondary | ICD-10-CM

## 2010-12-07 DIAGNOSIS — J449 Chronic obstructive pulmonary disease, unspecified: Secondary | ICD-10-CM

## 2010-12-07 DIAGNOSIS — I4891 Unspecified atrial fibrillation: Secondary | ICD-10-CM

## 2010-12-07 DIAGNOSIS — R609 Edema, unspecified: Secondary | ICD-10-CM

## 2010-12-07 DIAGNOSIS — I5022 Chronic systolic (congestive) heart failure: Secondary | ICD-10-CM

## 2010-12-07 DIAGNOSIS — E119 Type 2 diabetes mellitus without complications: Secondary | ICD-10-CM

## 2010-12-07 DIAGNOSIS — I6529 Occlusion and stenosis of unspecified carotid artery: Secondary | ICD-10-CM

## 2010-12-07 MED ORDER — FUROSEMIDE 40 MG PO TABS: 40.0000 mg | ORAL_TABLET | Freq: Two times a day (BID) | ORAL | Status: DC | PRN

## 2010-12-07 NOTE — Progress Notes (Signed)
Patient ID: Jose Murray, male    DOB: 1940/10/03, 70 y.o.   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 Daleen Squibb. (Had multiple DC-CVs prior to Rhythmol)   Admitted in August 2011 to [facility] with tachycardia and CHF.  atrial fib. Echo showed EF 45% (down from 50-55%) with mild to moderately reduced RV function and diastolic dysfunction. Diuresed and rate controlled. Rhtyhmol stopped. Started on Pradaxa and plans made for outpatient DC-CV in 4 weeks.     in september was started on amiodarone.   Overall, he reports that he is doing well. He Continues with chronic LE edema which resolves overnight. He does have occasional cough at nighttime when he lies down which he attributes to fluid. He did smoke for 50 years but stopped in 2008. Diuretic does not seem to improve his lower extremity edema. No CP, orthopnea or PND.  No bleeding on pradaxa. Insurance company switched simva to pravachol due to interaction with amio.    Myoview 2006 EF 54%. no ischemia.     Carotid dopplers 8/10: 0-39% L 60-79% R  EKG shows normal sinus rhythm with rate 66 beats per minute, right bundle branch block   Outpatient Encounter Prescriptions as of 12/07/2010  Medication Sig Dispense Refill  . amiodarone (PACERONE) 200 MG tablet Take 1 tablet (200 mg total) by mouth daily.  30 tablet  5  . dabigatran (PRADAXA) 150 MG CAPS Take 1 capsule (150 mg total) by mouth 2 (two) times daily.  60 capsule  6  . dutasteride (AVODART) 0.5 MG capsule Take 0.5 mg by mouth 2 (two) times a week.        . gabapentin (NEURONTIN) 800 MG tablet Take 1/2 tablet by mouth four times daily  180 tablet  3  . glipiZIDE (GLUCOTROL) 10 MG tablet TAKE 1 TABLET BY MOUTH ONCE A DAY  90 tablet  2  . insulin  glargine (LANTUS) 100 UNIT/ML injection Inject 35 units as directed each evening.        . Insulin Pen Needle 29G X MISC by Does not apply route.        . Insulin Syringes, Disposable, U-100 0.3 ML MISC by Does not apply route.        . metFORMIN (GLUCOPHAGE) 500 MG tablet 1 1/2 tablets by mouth  2 times a day      . nitrofurantoin (MACRODANTIN) 100 MG capsule Take 100 mg by mouth at bedtime.        . pravastatin (PRAVACHOL) 20 MG tablet Take 20 mg by mouth daily.        . Tamsulosin HCl (FLOMAX) 0.4 MG CAPS Take by mouth daily.        .  furosemide (LASIX) 40 MG tablet Take 40 mg by mouth daily as needed.           Review of Systems  Constitutional: Negative.   HENT: Negative.   Eyes: Negative.   Respiratory: Positive for cough and shortness of breath.   Cardiovascular: Positive for leg swelling.  Gastrointestinal: Negative.   Musculoskeletal: Positive for gait problem.  Skin: Negative.   Neurological: Negative.   Hematological: Negative.   Psychiatric/Behavioral: Negative.   All other systems reviewed and are negative.    BP 113/62  Pulse  65  Ht 6' (1.829 m) Unable to obtain weight as in a wheelchair  Physical Exam  Nursing note and vitals reviewed. Constitutional: He is oriented to person, place, and time. He appears well-developed and well-nourished.       Quadriplegia with upper extremity weakness.  HENT:  Head: Normocephalic.  Nose: Nose normal.  Mouth/Throat: Oropharynx is clear and moist.  Eyes: Conjunctivae are normal. Pupils are equal, round, and reactive to light.  Neck: Normal range of motion. Neck supple. No JVD present.  Cardiovascular: Normal rate, regular rhythm, S1 normal, S2 normal, normal heart sounds and intact distal pulses.  Exam reveals no gallop and no friction rub.   No murmur heard.      Pitting edema of the lower extremities to the mid shin, 1+  Pulmonary/Chest: Effort normal and breath sounds normal. No respiratory distress. He has no  wheezes. He has no rales. He exhibits no tenderness.  Abdominal: Soft. Bowel sounds are normal. He exhibits no distension. There is no tenderness.  Musculoskeletal: Normal range of motion. He exhibits no edema and no tenderness.  Lymphadenopathy:    He has no cervical adenopathy.  Neurological: He is alert and oriented to person, place, and time. Coordination normal.  Skin: Skin is warm and dry. No rash noted. No erythema.  Psychiatric: He has a normal mood and affect. His behavior is normal. Judgment and thought content normal.           Assessment and Plan

## 2010-12-07 NOTE — Assessment & Plan Note (Signed)
He denies any further tachyarrhythmias. Rhythm today is sinus. He is on anticoagulation for atrial fibrillation.

## 2010-12-07 NOTE — Assessment & Plan Note (Signed)
He is unable to exercise secondary to quadriplegia. We have suggested he continue to monitor his diet closely.

## 2010-12-07 NOTE — Assessment & Plan Note (Signed)
Long history of smoking for 50 years. I suspect that some of his cough at nighttime which he attributes to fluid may in fact be secondary to smoker cough. He likely has underlying emphysema. He does not feel that he needs inhalers.

## 2010-12-07 NOTE — Patient Instructions (Signed)
You are doing well. No medication changes were made. Please call us if you have new issues that need to be addressed before your next appt.  We will call you for a follow up Appt. In 6 months  

## 2010-12-07 NOTE — Assessment & Plan Note (Signed)
Previous ejection fraction of 45% last year in the setting of atrial fibrillation. I believe his edema is secondary to venous insufficiency and not from systolic heart failure. As he has no renal insufficiency, he can take Lasix as needed.

## 2010-12-07 NOTE — Assessment & Plan Note (Signed)
History of underlying coronary arterial disease. We have suggested he continue his statin. Cholesterol is at goal. He no longer smokes

## 2010-12-07 NOTE — Assessment & Plan Note (Signed)
Cholesterol is at goal on the current lipid regimen. No changes to the medications were made.  

## 2011-01-04 ENCOUNTER — Telehealth: Payer: Self-pay

## 2011-01-04 NOTE — Telephone Encounter (Signed)
Please have them send me a form- I will take a look at it , now generally medicare requires a face to face visit - but I will take a look at it and see before I advise him further- thanks

## 2011-01-04 NOTE — Telephone Encounter (Signed)
Pt said he needs a new power chair and the company that supplies the chair said pt could either get a letter from his doctor or there is also a form that can be filled out to apply for a new chair. I explained pt might need to come in for an evaluation and pt said he has not been able to walk since 2004. Please advise. Pt can be reached at [phone]. Pt understands Dr Milinda Antis is out of the office until 01/05/11.

## 2011-01-05 NOTE — Telephone Encounter (Signed)
Patient notified as instructed by telephone.  Pt will have co fax form to Dr Milinda Antis [phone].

## 2011-01-25 ENCOUNTER — Telehealth: Payer: Self-pay | Admitting: *Deleted

## 2011-01-25 NOTE — Telephone Encounter (Signed)
Form for diabetic supplies is on your shelf.  I checked with the patient and he does use this company.

## 2011-01-29 NOTE — Telephone Encounter (Signed)
Jacki Cones took to fax to company.

## 2011-01-29 NOTE — Telephone Encounter (Signed)
Done and in IN box 

## 2011-02-02 ENCOUNTER — Ambulatory Visit (INDEPENDENT_AMBULATORY_CARE_PROVIDER_SITE_OTHER): Payer: Medicare Other | Admitting: Family Medicine

## 2011-02-02 ENCOUNTER — Encounter: Payer: Self-pay | Admitting: Family Medicine

## 2011-02-02 VITALS — BP 100/60 | HR 56 | Temp 98.3°F

## 2011-02-02 DIAGNOSIS — E119 Type 2 diabetes mellitus without complications: Secondary | ICD-10-CM

## 2011-02-02 DIAGNOSIS — Z23 Encounter for immunization: Secondary | ICD-10-CM

## 2011-02-02 DIAGNOSIS — IMO0002 Reserved for concepts with insufficient information to code with codable children: Secondary | ICD-10-CM

## 2011-02-02 LAB — RENAL FUNCTION PANEL
BUN: 36 mg/dL — ABNORMAL HIGH (ref 6–23)
Calcium: 8.7 mg/dL (ref 8.4–10.5)
Phosphorus: 2.6 mg/dL (ref 2.3–4.6)
Potassium: 4.2 mEq/L (ref 3.5–5.3)
Sodium: 141 mEq/L (ref 135–145)

## 2011-02-02 NOTE — Progress Notes (Signed)
Subjective:    Patient ID: Jose Murray, male    DOB: November 01, 1940, 70 y.o.   MRN: [id number]  HPI Here for eval for power mobility device   He currently uses quantum 600 E power chair  Is worn completely out -had it over 6 years -- has to use it every minute that he is up  Had a team come out to analyze chair and agreed they needed a new one Mayford Knife)   Became unable to walk 2004  Started with problems with lower back -- got up to get in loveseat - fell and his chin hit furniture - snapped head back  Sustained injury to upper CS cord  Incidentally already had cervical disc dz and surgery in 98 - though this did not lead to the injury  Initially was fully paralyzed from the neck down - then a little improvement in control of arms over time with intense therapy  Not enough strength to power a manual wheelchair  Can control joystick with L hand on power chair   R hand/ wrist with contracture  Hands are numb  Very little in ankles or feet   Pt has hx of spinal cord injury with quadroplegia   In terms of DM - doing pretty good with his sugars  Cut his insulin down 20-30 units  Is eating better too - knows what to eat and what he shouldn't eat  Lab Results  Component Value Date   HGBA1C 7.2* 07/26/2010   needs a1c today- and expects improvement  Patient Active Problem List  Diagnoses  . DIABETES MELLITUS, TYPE II  . HYPERLIPIDEMIA  . RBB W/ LFAB  . ATRIAL FIBRILLATION  . ATRIAL FLUTTER  . SYSTOLIC HEART FAILURE, CHRONIC  . CAROTID OCCLUSIVE DISEASE  . COPD  . URINARY TRACT INFECTION, CHRONIC  . URINARY RETENTION  . MICROALBUMINURIA  . SPINAL CORD INJURY  . TOBACCO USE, QUIT  . Hyperkalemia   Past Medical History  Diagnosis Date  . Atrial fibrillation   . COPD (chronic obstructive pulmonary disease)   . Diabetes mellitus   . Hyperlipidemia   . Spinal cord injury     wheelchair bound  . Neurogenic bladder     occ infections  . Carotid stenosis   . Chronic UTI     Past Surgical History  Procedure Date  . Eye surgery     cataract extraction  . Orchiectomy     left, secondary to torsion  . X-stop implantation 1998    decompression, spinal surgery 04/2003  . Cervical spine surgery     bone graft  . Cardiac catheterization 1992  . Cervical discectomy [id number]  . Spinal cord decompression     C4   History  Substance Use Topics  . Smoking status: Former Smoker    Quit date: 04/16/2006  . Smokeless tobacco: Not on file  . Alcohol Use: No   Family History  Problem Relation Age of Onset  . Stroke Mother   . Alzheimer's disease Mother    Allergies  Allergen Reactions  . Quinidine    Current Outpatient Prescriptions on File Prior to Visit  Medication Sig Dispense Refill  . amiodarone (PACERONE) 200 MG tablet Take 1 tablet (200 mg total) by mouth daily.  30 tablet  5  . dabigatran (PRADAXA) 150 MG CAPS Take 1 capsule (150 mg total) by mouth 2 (two) times daily.  60 capsule  6  . dutasteride (AVODART) 0.5 MG capsule Take 0.5 mg by mouth  2 (two) times a week.        . furosemide (LASIX) 40 MG tablet Take 1 tablet (40 mg total) by mouth 2 (two) times daily as needed.  60 tablet  6  . gabapentin (NEURONTIN) 800 MG tablet Take 1/2 tablet by mouth four times daily  180 tablet  3  . glipiZIDE (GLUCOTROL) 10 MG tablet TAKE 1 TABLET BY MOUTH ONCE A DAY  90 tablet  2  . insulin glargine (LANTUS) 100 UNIT/ML injection Inject 25-30 units as directed each evening.       . Insulin Pen Needle 29G X MISC by Does not apply route.        . Insulin Syringes, Disposable, U-100 0.3 ML MISC by Does not apply route.        . metFORMIN (GLUCOPHAGE) 500 MG tablet 1 1/2 tablets by mouth  2 times a day      . nitrofurantoin (MACRODANTIN) 100 MG capsule Take 100 mg by mouth at bedtime.        . pravastatin (PRAVACHOL) 20 MG tablet Take 20 mg by mouth daily.        . Tamsulosin HCl (FLOMAX) 0.4 MG CAPS Take by mouth daily.               Review of  Systems Review of Systems  Constitutional: Negative for fever, appetite change, fatigue and unexpected weight change.  Eyes: Negative for pain and visual disturbance.  Respiratory: Negative for cough and shortness of breath.   Cardiovascular: Negative for cp or palpitations    Gastrointestinal: Negative for nausea, diarrhea and constipation.  Genitourinary: Negative for urgency and frequency. no increased thirst  Skin: Negative for pallor or rash   Neurological: pos for muscle weakness/ contracture and spasticity, also numbness, no seizures or headaches.  Hematological: Negative for adenopathy. Does not bruise/bleed easily.  Psychiatric/Behavioral: Negative for dysphoric mood. The patient is not nervous/anxious.          Objective:   Physical Exam  Constitutional: He appears well-developed and well-nourished. No distress.       Baseline in wheelchair Spirits are good  Family member present   HENT:  Head: Normocephalic and atraumatic.  Mouth/Throat: Oropharynx is clear and moist.  Eyes: Conjunctivae and EOM are normal. Pupils are equal, round, and reactive to light. No scleral icterus.  Neck: Normal range of motion. Neck supple. No JVD present. Carotid bruit is not present. No thyromegaly present.  Cardiovascular: Normal rate, regular rhythm, normal heart sounds and intact distal pulses.   No murmur heard. Pulmonary/Chest: Effort normal and breath sounds normal. No respiratory distress. He has no wheezes.       Diffusely distant bs   Abdominal: Soft. Bowel sounds are normal. He exhibits no distension and no mass. There is no tenderness.  Musculoskeletal: Normal range of motion. He exhibits edema. He exhibits no tenderness.  Lymphadenopathy:    He has no cervical adenopathy.  Neurological: He is alert. He displays atrophy, tremor and abnormal reflex. A sensory deficit is present. No cranial nerve deficit. He exhibits abnormal muscle tone. He displays no seizure activity. Coordination  abnormal.       Diffuse weakness in both hands worse on the R R hand contracture  Very little strength in both legs / feet with no sensation to lt touch or temp or vibration  Unable to stand - even with assistance   Hyperactive /spastic DTRs in all ext    Skin: Skin is warm and dry.  No rash noted. No erythema. No pallor.  Psychiatric: He has a normal mood and affect. Thought content normal.          Assessment & Plan:

## 2011-02-02 NOTE — Assessment & Plan Note (Signed)
Pt here for mobility exam for his power motility device  Quantum 600 E power chair (his is 70 years old and no longer working) This is the item to order Pt name is Jose Murray  Face to face exam today 02/02/11 Dx is spinal cord injury with quadriplegia 952.9 (after a fall) He currently has very limited use of L hand only - with ability to manually power joystick Not enough lower ext strength or upper body strength to power a manual wheelchair or walk assisted at all  Also no sensation Length of need if lifetime

## 2011-02-02 NOTE — Patient Instructions (Addendum)
I will get your paperwork done for [facility] for your power chair  a1c today - I'm glad sugars are doing better  Try to keep up diabetic diet  Flu shot today

## 2011-02-03 LAB — HEMOGLOBIN A1C
Hgb A1c MFr Bld: 7.1 % — ABNORMAL HIGH (ref <5.7)
Mean Plasma Glucose: 157 mg/dL — ABNORMAL HIGH (ref <117)

## 2011-02-04 NOTE — Assessment & Plan Note (Signed)
Doing better with lantus - per report expect imp a1c  Check today  Is eating better as well

## 2011-02-06 ENCOUNTER — Telehealth: Payer: Self-pay | Admitting: *Deleted

## 2011-02-06 NOTE — Telephone Encounter (Signed)
Patient called stating that he has gotten a letter from the court system to appear for jury duty. Patient states that he is in a wheelchair and unable to go for jury duty. Patient will have someone drop off the form for you to mark the reason that he can not appear for this and sign it.

## 2011-02-06 NOTE — Telephone Encounter (Signed)
Patient notified as instructed by telephone.  Pt said will send by someone on 02/07/11.

## 2011-02-06 NOTE — Telephone Encounter (Signed)
That is fine - just send this message back when form arrives

## 2011-02-08 ENCOUNTER — Telehealth: Payer: Self-pay | Admitting: *Deleted

## 2011-02-08 NOTE — Telephone Encounter (Signed)
Pt needs letter excusing him from jury duty on 03/27/11. He has brought in his summons for you to see, this is on your shelf.  Please call him when letter is ready.

## 2011-02-09 NOTE — Telephone Encounter (Signed)
Completed form given to [facility] and she will contact pt.

## 2011-02-09 NOTE — Telephone Encounter (Signed)
I wrote on the form and signed it - in the IN box  Let me know if I also need to write a letter

## 2011-02-09 NOTE — Telephone Encounter (Signed)
Completed form given to Johanna and she will contact pt. 

## 2011-02-19 ENCOUNTER — Other Ambulatory Visit: Payer: Self-pay | Admitting: *Deleted

## 2011-02-19 MED ORDER — METFORMIN HCL 500 MG PO TABS: ORAL_TABLET | ORAL | Status: DC

## 2011-02-19 MED ORDER — PRAVASTATIN SODIUM 20 MG PO TABS: 20.0000 mg | ORAL_TABLET | Freq: Every day | ORAL | Status: DC

## 2011-02-19 NOTE — Telephone Encounter (Signed)
Refill request from [facility], pt is requesting 90 day supply.  [facility] is questioning dosing listed on epic med list of to take 1 capsule 2 times a week.

## 2011-02-19 NOTE — Telephone Encounter (Signed)
?   I think this comes from his urologist

## 2011-02-20 ENCOUNTER — Encounter: Payer: Self-pay | Admitting: Family Medicine

## 2011-02-20 ENCOUNTER — Ambulatory Visit (INDEPENDENT_AMBULATORY_CARE_PROVIDER_SITE_OTHER): Payer: Medicare Other | Admitting: Family Medicine

## 2011-02-20 VITALS — BP 100/58 | HR 60 | Temp 97.9°F

## 2011-02-20 DIAGNOSIS — E875 Hyperkalemia: Secondary | ICD-10-CM

## 2011-02-20 DIAGNOSIS — E119 Type 2 diabetes mellitus without complications: Secondary | ICD-10-CM

## 2011-02-20 DIAGNOSIS — G8929 Other chronic pain: Secondary | ICD-10-CM

## 2011-02-20 DIAGNOSIS — M25519 Pain in unspecified shoulder: Secondary | ICD-10-CM

## 2011-02-20 MED ORDER — TRAMADOL HCL 50 MG PO TABS: 50.0000 mg | ORAL_TABLET | Freq: Three times a day (TID) | ORAL | Status: DC | PRN

## 2011-02-20 NOTE — Assessment & Plan Note (Signed)
Chemistry      Component Value Date/Time   NA 141 02/02/2011 1626   K 4.2 02/02/2011 1626   CL 106 02/02/2011 1626   CO2 22 02/02/2011 1626   BUN 36* 02/02/2011 1626   CREATININE 1.05 02/02/2011 1626   CREATININE 1.1 08/15/2010 1517      Component Value Date/Time   CALCIUM 8.7 02/02/2011 1626   ALKPHOS 52 06/28/2008 0851   AST 14 07/26/2010 0845   ALT 17 07/26/2010 0845   BILITOT 0.9 06/28/2008 0851

## 2011-02-20 NOTE — Assessment & Plan Note (Signed)
Intermittent chronic pain - needs pain relief once in a while  Will try tramadol with caution and update

## 2011-02-20 NOTE — Telephone Encounter (Signed)
Spoke with April at [facility] and it was an error Avodart refill was sent to Dr Milinda Antis. [facility] has sent refill request to urologist, Dr Achilles Dunk.

## 2011-02-20 NOTE — Patient Instructions (Addendum)
Continue current medicines the way you take them - but if sugar is 130-160 at night - take just 10 units of lantus instead of none (and eat a small bedtime snack)  Let me know if low sugars or other problems  Keep working on diabetic diet  No other medicine changes  I sent tramadol to your pharmacy- only use it for severe pain - it can make you dizzy or sleepy and can be habit forming  Schedule non fasting lab 3 mo Schedule f/u in 6 months

## 2011-02-20 NOTE — Progress Notes (Signed)
Subjective:    Patient ID: Jose Murray, male    DOB: 1941/01/25, 70 y.o.   MRN: [id number]  HPI Here for f/u of DM and hyperkalemia and chronic shoulder pain   Thinks has had bursitis that comes and goes  Feeling about the same  Tylenol does not work Manufacturing systems engineer take nsaids   Nothing new going on   Got his chair approved - and is still waiting on delivery   a1c today is 7.1 (down from 7.2) with mean glucose of 157 Is on lantus as well as metformin and glipizide Sugar has been doing pretty well at home - on the meter  He checks it in ams fasting and before bed Am low 100s ,  Pm - if no insulin 150s  If it is below 160 - does not take the insulin- because he is afraid of low sugar in ams  If 180-190 will take 20 units  Most of the time it is up as high as 226- then will take 25 units  In general is eating much better than he used to  opthy-- was in spring 3/12 -- no DM damage   K is 4.2 and cr 1.05- both ok (he stopped eating bananas) Not on ace or arb due to high K in past  No symptoms/ cramping/palpitations   utis are still a lot better than they were  Is drinking enough water  Sees urol Still caths  Patient Active Problem List  Diagnoses  . DIABETES MELLITUS, TYPE II  . HYPERLIPIDEMIA  . RBB W/ LFAB  . ATRIAL FIBRILLATION  . ATRIAL FLUTTER  . SYSTOLIC HEART FAILURE, CHRONIC  . CAROTID OCCLUSIVE DISEASE  . COPD  . URINARY TRACT INFECTION, CHRONIC  . URINARY RETENTION  . MICROALBUMINURIA  . SPINAL CORD INJURY  . TOBACCO USE, QUIT  . Hyperkalemia  . Chronic shoulder pain   Past Medical History  Diagnosis Date  . Atrial fibrillation   . COPD (chronic obstructive pulmonary disease)   . Diabetes mellitus   . Hyperlipidemia   . Spinal cord injury     wheelchair bound  . Neurogenic bladder     occ infections  . Carotid stenosis   . Chronic UTI    Past Surgical History  Procedure Date  . Eye surgery     cataract extraction  . Orchiectomy     left,  secondary to torsion  . X-stop implantation 1998    decompression, spinal surgery 04/2003  . Cervical spine surgery     bone graft  . Cardiac catheterization 1992  . Cervical discectomy [id number]  . Spinal cord decompression     C4   History  Substance Use Topics  . Smoking status: Former Smoker    Quit date: 04/16/2006  . Smokeless tobacco: Not on file  . Alcohol Use: No   Family History  Problem Relation Age of Onset  . Stroke Mother   . Alzheimer's disease Mother    Allergies  Allergen Reactions  . Quinidine    Current Outpatient Prescriptions on File Prior to Visit  Medication Sig Dispense Refill  . amiodarone (PACERONE) 200 MG tablet Take 1 tablet (200 mg total) by mouth daily.  30 tablet  5  . dabigatran (PRADAXA) 150 MG CAPS Take 1 capsule (150 mg total) by mouth 2 (two) times daily.  60 capsule  6  . dutasteride (AVODART) 0.5 MG capsule Take 0.5 mg by mouth 2 (two) times a week.        Marland Kitchen  furosemide (LASIX) 40 MG tablet Take 1 tablet (40 mg total) by mouth 2 (two) times daily as needed.  60 tablet  6  . gabapentin (NEURONTIN) 800 MG tablet Take 1/2 tablet by mouth four times daily  180 tablet  3  . glipiZIDE (GLUCOTROL) 10 MG tablet TAKE 1 TABLET BY MOUTH ONCE A DAY  90 tablet  2  . insulin glargine (LANTUS) 100 UNIT/ML injection Inject 25-30 units as directed each evening.       . Insulin Pen Needle 29G X MISC by Does not apply route.        . Insulin Syringes, Disposable, U-100 0.3 ML MISC by Does not apply route.        . metFORMIN (GLUCOPHAGE) 500 MG tablet 1 1/2 tablets by mouth  2 times a day  225 tablet  3  . nitrofurantoin (MACRODANTIN) 100 MG capsule Take 100 mg by mouth at bedtime.        . pravastatin (PRAVACHOL) 20 MG tablet Take 1 tablet (20 mg total) by mouth daily.  90 tablet  3  . Tamsulosin HCl (FLOMAX) 0.4 MG CAPS Take by mouth daily.              Review of Systems Review of Systems  Constitutional: Negative for fever, appetite change,  fatigue and unexpected weight change.  Eyes: Negative for pain and visual disturbance.  Respiratory: Negative for cough and shortness of breath.   Cardiovascular: Negative for cp or palpitations    Gastrointestinal: Negative for nausea, diarrhea and constipation.  Genitourinary: Negative for urgency and frequency.  Skin: Negative for pallor or rash   Neurologic pos for weakness and numbness, neg for tremor  and headaches.  Hematological: Negative for adenopathy. Does not bruise/bleed easily.  Psychiatric/Behavioral: Negative for dysphoric mood. The patient is not nervous/anxious.          Objective:   Physical Exam  Constitutional: He appears well-developed and well-nourished. No distress.       Wheelchair bound, well appearing   HENT:  Head: Normocephalic and atraumatic.  Mouth/Throat: Oropharynx is clear and moist.  Eyes: Conjunctivae and EOM are normal. Pupils are equal, round, and reactive to light.  Neck: Normal range of motion. Neck supple. No JVD present. Carotid bruit is not present.  Cardiovascular: Normal rate, regular rhythm, normal heart sounds and intact distal pulses.  Exam reveals no gallop.   Pulmonary/Chest: Effort normal and breath sounds normal. No respiratory distress. He has no rales. He exhibits no tenderness.  Abdominal: Soft. Bowel sounds are normal. He exhibits no distension, no abdominal bruit and no mass. There is no tenderness.  Musculoskeletal: Normal range of motion. He exhibits edema and tenderness.       Poor rom both shoulders Problems abducting past 90 degrees Some mild acromion tenderness   Lymphadenopathy:    He has no cervical adenopathy.  Neurological: He is alert. He has normal reflexes. No cranial nerve deficit. He exhibits abnormal muscle tone.  Skin: Skin is warm and dry. No rash noted. No erythema. No pallor.  Psychiatric: He has a normal mood and affect.          Assessment & Plan:

## 2011-02-20 NOTE — Assessment & Plan Note (Signed)
a1c is 7.1 Pt is trying to prevent night time hypoglycemia and sometimes skips lantus Made deal to take 10 u if pm sugar is 130-160- and eat a small bedtime snack Commended on better diet  opthy is up to date  No ace or arb due to past high K  a1c 3 mo  If ok will f/u 6 mo

## 2011-05-07 ENCOUNTER — Other Ambulatory Visit: Payer: Self-pay

## 2011-05-07 ENCOUNTER — Other Ambulatory Visit: Payer: Self-pay | Admitting: *Deleted

## 2011-05-07 MED ORDER — AMIODARONE HCL 200 MG PO TABS: 200.0000 mg | ORAL_TABLET | Freq: Every day | ORAL | Status: DC

## 2011-05-07 MED ORDER — DABIGATRAN ETEXILATE MESYLATE 150 MG PO CAPS: 150.0000 mg | ORAL_CAPSULE | Freq: Two times a day (BID) | ORAL | Status: DC

## 2011-05-07 MED ORDER — GLIPIZIDE 10 MG PO TABS: 10.0000 mg | ORAL_TABLET | Freq: Every day | ORAL | Status: DC

## 2011-05-23 ENCOUNTER — Other Ambulatory Visit: Payer: Medicare Other

## 2011-05-25 ENCOUNTER — Other Ambulatory Visit (INDEPENDENT_AMBULATORY_CARE_PROVIDER_SITE_OTHER): Payer: Medicare Other

## 2011-05-25 DIAGNOSIS — E119 Type 2 diabetes mellitus without complications: Secondary | ICD-10-CM

## 2011-05-26 LAB — HEMOGLOBIN A1C
Hgb A1c MFr Bld: 7.6 % — ABNORMAL HIGH (ref <5.7)
Mean Plasma Glucose: 171 mg/dL — ABNORMAL HIGH (ref <117)

## 2011-06-13 ENCOUNTER — Telehealth: Payer: Self-pay

## 2011-06-13 NOTE — Telephone Encounter (Signed)
Pt son Vonna Kotyk called with blood sugar readings.  Pt did not make notation what had eaten when BS high. Date  Fasting BS  2 hr after supper  05/28/11 134   179 05/29/11 111   165 05/30/11 107   206 05/31/11 101   189 06/01/11 129   170 06/02/11   98   195 06/03/11   94   161 06/04/11 116   114 06/05/11   82   216 06/06/11 140   179 06/07/11 103   298 06/08/11   90   257 06/09/11   84     62 06/10/11 144   215 06/11/11    99   153 06/12/11 100     99

## 2011-06-13 NOTE — Telephone Encounter (Signed)
Jose Murray next scheduled appt is 08/17/11 at 3:15pm for 6 mth f/u.

## 2011-06-13 NOTE — Telephone Encounter (Signed)
Pm sugars are extremely variable - please check bid for another week but also keep a food diary of what is eaten  Write down and mail or fax to [place]  Thanks

## 2011-06-13 NOTE — Telephone Encounter (Signed)
Patient notified as instructed by telephone. Pt said Vonna Kotyk would drop off readings at Dr Lucretia Roers office.

## 2011-07-11 ENCOUNTER — Other Ambulatory Visit: Payer: Self-pay | Admitting: *Deleted

## 2011-07-11 MED ORDER — DABIGATRAN ETEXILATE MESYLATE 150 MG PO CAPS: 150.0000 mg | ORAL_CAPSULE | Freq: Two times a day (BID) | ORAL | Status: DC

## 2011-07-11 MED ORDER — AMIODARONE HCL 200 MG PO TABS: 200.0000 mg | ORAL_TABLET | Freq: Every day | ORAL | Status: DC

## 2011-08-10 ENCOUNTER — Telehealth: Payer: Self-pay

## 2011-08-10 DIAGNOSIS — E875 Hyperkalemia: Secondary | ICD-10-CM

## 2011-08-10 MED ORDER — GLUCOSE BLOOD VI STRP: ORAL_STRIP | Status: DC

## 2011-08-10 NOTE — Telephone Encounter (Signed)
Thanks , I will order the lab

## 2011-08-10 NOTE — Telephone Encounter (Signed)
Spoke with Jasmine December and advised as instructed.  She stated that it is very hard for Jose Murray to get out because he is unsteady and needs assistance to get around.  Scheduled him for lab appt on 08/13/2011, will ask Terri to draw him outside in the car.

## 2011-08-10 NOTE — Telephone Encounter (Signed)
Britt Boozer said pt has been taking Lasix 40 mg twice daily for 1 week due to increased swelling in lower legs and knees. Jasmine December put in Foley indwelling catheter last night; pt could not void (has not had that problem in yrs.). Jasmine December will continue Lasix 40 mg bid and wants to know if pt should be taking Potassium supplement. Pt last seen 02/20/11 and pt has upcoming appt 08/17/11.Pt uses [employer] and sharon can be reached at [phone].

## 2011-08-10 NOTE — Telephone Encounter (Signed)
We really need to check a K level first - any way they can do that there?

## 2011-08-10 NOTE — Telephone Encounter (Signed)
Left message on cell phone voicemail for Jose Murray to return call.

## 2011-08-13 ENCOUNTER — Other Ambulatory Visit: Payer: Self-pay | Admitting: Family Medicine

## 2011-08-13 ENCOUNTER — Other Ambulatory Visit: Payer: Medicare Other

## 2011-08-13 DIAGNOSIS — E875 Hyperkalemia: Secondary | ICD-10-CM

## 2011-08-17 ENCOUNTER — Encounter: Payer: Self-pay | Admitting: Family Medicine

## 2011-08-17 ENCOUNTER — Ambulatory Visit (INDEPENDENT_AMBULATORY_CARE_PROVIDER_SITE_OTHER): Payer: Medicare Other | Admitting: Family Medicine

## 2011-08-17 ENCOUNTER — Other Ambulatory Visit: Payer: Self-pay | Admitting: *Deleted

## 2011-08-17 VITALS — BP 112/60 | HR 64 | Temp 97.8°F | Ht 72.0 in

## 2011-08-17 DIAGNOSIS — IMO0002 Reserved for concepts with insufficient information to code with codable children: Secondary | ICD-10-CM

## 2011-08-17 DIAGNOSIS — R600 Localized edema: Secondary | ICD-10-CM | POA: Insufficient documentation

## 2011-08-17 DIAGNOSIS — E119 Type 2 diabetes mellitus without complications: Secondary | ICD-10-CM

## 2011-08-17 DIAGNOSIS — R609 Edema, unspecified: Secondary | ICD-10-CM

## 2011-08-17 DIAGNOSIS — H538 Other visual disturbances: Secondary | ICD-10-CM

## 2011-08-17 LAB — HEMOGLOBIN A1C
Hgb A1c MFr Bld: 7.4 % — ABNORMAL HIGH (ref <5.7)
Mean Plasma Glucose: 166 mg/dL — ABNORMAL HIGH (ref <117)

## 2011-08-17 MED ORDER — INSULIN GLARGINE 100 UNIT/ML ~~LOC~~ SOLN: SUBCUTANEOUS | Status: DC

## 2011-08-17 NOTE — Assessment & Plan Note (Signed)
Per pt is new - ? If cataract or other  Both pupils are constricted  Is due for dilated DM exam He will make his own appt at [facility]  No ha or other symptoms

## 2011-08-17 NOTE — Assessment & Plan Note (Signed)
This is a bit worse- taking more lasix at intervals Renal panel today

## 2011-08-17 NOTE — Progress Notes (Signed)
Subjective:    Patient ID: Jose Murray, male    DOB: March 11, 1941, 71 y.o.   MRN: [id number]  HPI Here for f/u of DM  On glipizide and lantus and metformin   a1c is 7.6 9up from 7.1 back in February   The tramadol did not help his shoulder pain at all  Shoulder pain still comes and goes (will get better for 2-3 wk at a time)  ? If arthritis or bursitis  Not in neck    Sugars are a lot better than they were last time  Lab Results  Component Value Date   HGBA1C 7.6* 05/25/2011    ams 70s-122 average , and at night usually in the 180s (highest 213) -- about 2-3 hours after eating  Has made adj of lantus -to minimize hypoglycemic  Below 180-- takes 20  At 180 -- 25 If above 200- still takes 25  Once or twice got too low in early am - but less than it was before   Getting weaker with time  Frustrating  Has hired someone to stay with him  Has exercise room - she can help him with that  Wonders if his disc problems are getting worse  Sees neuro surg- Jeral Fruit -- will not see unless he has new MRI (needs total sedation for that) Would need versed for MRI(with anesthesia)   Had a foley for a while - but no infection  He had urinary retention  His lasix was inc to bid -- some days takes once daily and some days twice  , depending   Patient Active Problem List  Diagnoses  . DIABETES MELLITUS, TYPE II  . HYPERLIPIDEMIA  . RBB W/ LFAB  . ATRIAL FIBRILLATION  . ATRIAL FLUTTER  . SYSTOLIC HEART FAILURE, CHRONIC  . CAROTID OCCLUSIVE DISEASE  . COPD  . URINARY TRACT INFECTION, CHRONIC  . URINARY RETENTION  . MICROALBUMINURIA  . SPINAL CORD INJURY  . TOBACCO USE, QUIT  . Hyperkalemia  . Chronic shoulder pain  . Blurry vision, right eye  . Pedal edema   Past Medical History  Diagnosis Date  . Atrial fibrillation   . COPD (chronic obstructive pulmonary disease)   . Diabetes mellitus   . Hyperlipidemia   . Spinal cord injury     wheelchair bound  . Neurogenic bladder     occ infections  . Carotid stenosis   . Chronic UTI    Past Surgical History  Procedure Date  . Eye surgery     cataract extraction  . Orchiectomy     left, secondary to torsion  . X-stop implantation 1998    decompression, spinal surgery 04/2003  . Cervical spine surgery     bone graft  . Cardiac catheterization 1992  . Cervical discectomy [id number]  . Spinal cord decompression     C4   History  Substance Use Topics  . Smoking status: Former Smoker    Quit date: 04/16/2006  . Smokeless tobacco: Not on file  . Alcohol Use: No   Family History  Problem Relation Age of Onset  . Stroke Mother   . Alzheimer's disease Mother    Allergies  Allergen Reactions  . Quinidine    Current Outpatient Prescriptions on File Prior to Visit  Medication Sig Dispense Refill  . amiodarone (PACERONE) 200 MG tablet Take 1 tablet (200 mg total) by mouth daily.  30 tablet  0  . dabigatran (PRADAXA) 150 MG CAPS Take 1 capsule (150 mg total)  by mouth 2 (two) times daily.  60 capsule  0  . dutasteride (AVODART) 0.5 MG capsule Take 0.5 mg by mouth 2 (two) times a week.        . furosemide (LASIX) 40 MG tablet Take 1 tablet (40 mg total) by mouth 2 (two) times daily as needed.  60 tablet  6  . gabapentin (NEURONTIN) 800 MG tablet Take 1/2 tablet by mouth four times daily  180 tablet  3  . glipiZIDE (GLUCOTROL) 10 MG tablet Take 1 tablet (10 mg total) by mouth daily.  90 tablet  2  . glucose blood (ACCU-CHEK COMPACT STRIPS) test strip Use to check blood sugar up to three times daily  100 each  11  . insulin glargine (LANTUS) 100 UNIT/ML injection Inject 25-30 units as directed each evening.  30 mL  11  . Insulin Pen Needle 29G X MISC by Does not apply route.        . Insulin Syringes, Disposable, U-100 0.3 ML MISC by Does not apply route.        . metFORMIN (GLUCOPHAGE) 500 MG tablet 1 1/2 tablets by mouth  2 times a day  225 tablet  3  . nitrofurantoin (MACRODANTIN) 100 MG capsule Take 100 mg  by mouth at bedtime.        . pravastatin (PRAVACHOL) 20 MG tablet Take 1 tablet (20 mg total) by mouth daily.  90 tablet  3  . Tamsulosin HCl (FLOMAX) 0.4 MG CAPS Take by mouth daily.        . traMADol (ULTRAM) 50 MG tablet Take 1 tablet (50 mg total) by mouth every 8 (eight) hours as needed for pain. Maximum dose= 8 tablets per day  30 tablet  0       Review of Systems Review of Systems  Constitutional: Negative for fever, appetite change, and unexpected weight change.  Eyes: Negative for pain and visual disturbance.  Respiratory: Negative for cough and shortness of breath.   Cardiovascular: Negative for cp or palpitations    Gastrointestinal: Negative for nausea, diarrhea and constipation.  Genitourinary: Negative for urgency and frequency. no excessive thirst  Skin: Negative for pallor or rash  pos for redness in legs baseline  Neurological: Negative for , light-headedness, numbness and headaches.  Hematological: Negative for adenopathy. Does not bruise/bleed easily.  Psychiatric/Behavioral: pos for dysphoric mood at times . The patient is not nervous/anxious.          Objective:   Physical Exam  Constitutional: He appears well-developed and well-nourished. No distress.       Frail appearing in wheelchair   HENT:  Head: Normocephalic and atraumatic.  Mouth/Throat: Oropharynx is clear and moist.  Eyes: Conjunctivae and EOM are normal. Pupils are equal, round, and reactive to light. Right eye exhibits no discharge. Left eye exhibits no discharge. No scleral icterus.  Neck: Normal range of motion. Neck supple. No JVD present. Carotid bruit is not present. No thyromegaly present.  Cardiovascular: Normal rate, regular rhythm and normal heart sounds.  Exam reveals no gallop.   Pulmonary/Chest: Effort normal and breath sounds normal. No respiratory distress. He has no wheezes.       No crackles   Diffusely distant bs   Abdominal: Soft. Bowel sounds are normal. He exhibits no  distension, no abdominal bruit and no mass. There is no tenderness.  Musculoskeletal: He exhibits edema. He exhibits no tenderness.       1-2 plus pitting edema pedal with hyperemia  Lymphadenopathy:    He has no cervical adenopathy.  Neurological: He is alert. He has normal reflexes. No cranial nerve deficit. He exhibits normal muscle tone. Coordination normal.  Skin: Skin is warm and dry. No rash noted. No erythema. No pallor.  Psychiatric:       Seems somewhat fatigued and down today          Assessment & Plan:

## 2011-08-17 NOTE — Patient Instructions (Signed)
Labs for diabetes today , also electrolytes  Try support stockings again - let me know how it goes Work on increasing activity with your care worker  Get appt with your eye doctor as soon as you can for blurred vision and also diabetic exam  Follow up with me 6 months unless needed earlier

## 2011-08-17 NOTE — Assessment & Plan Note (Signed)
Per pt sugars are better overall  Diet fair  Will see his opthamologist  Has hx of hypoglycemia so we are careful with meds and insulin a1c and renal today

## 2011-08-17 NOTE — Assessment & Plan Note (Signed)
Per pt seems to be getting weaker Has hired help to get him going in am - and using exercise equipt If not imp may need MRI CS and f/u with Dr Jeral Fruit- will need aneth for that MRI

## 2011-08-18 LAB — RENAL FUNCTION PANEL
BUN: 31 mg/dL — ABNORMAL HIGH (ref 6–23)
Chloride: 102 mEq/L (ref 96–112)
Phosphorus: 3.2 mg/dL (ref 2.3–4.6)
Potassium: 4.7 mEq/L (ref 3.5–5.3)
Sodium: 140 mEq/L (ref 135–145)

## 2011-09-11 ENCOUNTER — Other Ambulatory Visit: Payer: Self-pay | Admitting: *Deleted

## 2011-09-11 MED ORDER — DABIGATRAN ETEXILATE MESYLATE 150 MG PO CAPS: 150.0000 mg | ORAL_CAPSULE | Freq: Two times a day (BID) | ORAL | Status: DC

## 2011-09-11 MED ORDER — AMIODARONE HCL 200 MG PO TABS: 200.0000 mg | ORAL_TABLET | Freq: Every day | ORAL | Status: DC

## 2011-09-11 NOTE — Telephone Encounter (Signed)
Refilled Pradaxa and Amiodarone.

## 2011-09-24 ENCOUNTER — Ambulatory Visit (INDEPENDENT_AMBULATORY_CARE_PROVIDER_SITE_OTHER): Payer: Medicare Other | Admitting: Cardiovascular Disease

## 2011-09-24 ENCOUNTER — Encounter: Payer: Self-pay | Admitting: Cardiovascular Disease

## 2011-09-24 VITALS — BP 90/54 | HR 81 | Resp 16 | Ht 72.0 in

## 2011-09-24 DIAGNOSIS — I4891 Unspecified atrial fibrillation: Secondary | ICD-10-CM

## 2011-09-24 DIAGNOSIS — I6529 Occlusion and stenosis of unspecified carotid artery: Secondary | ICD-10-CM

## 2011-09-24 DIAGNOSIS — R600 Localized edema: Secondary | ICD-10-CM

## 2011-09-24 DIAGNOSIS — R609 Edema, unspecified: Secondary | ICD-10-CM

## 2011-09-24 DIAGNOSIS — I4892 Unspecified atrial flutter: Secondary | ICD-10-CM

## 2011-09-24 NOTE — Patient Instructions (Signed)
You are doing well. No medication changes were made.  Please call us if you have new issues that need to be addressed before your next appt.  Your physician wants you to follow-up in: 6 months.  You will receive a reminder letter in the mail two months in advance. If you don't receive a letter, please call our office to schedule the follow-up appointment.   

## 2011-09-24 NOTE — Assessment & Plan Note (Signed)
Maintaining normal sinus rhythm. No medication changes made 

## 2011-09-24 NOTE — Assessment & Plan Note (Signed)
Continue aggressive cholesterol management. Will need repeat periodic carotid ultrasounds through our office.

## 2011-09-24 NOTE — Progress Notes (Signed)
Patient ID: Jose Murray, male    DOB: 1940/08/01, 71 y.o.   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 Daleen Squibb. (Had multiple DC-CVs prior to Rhythmol)   Admitted in August 2011 to [facility] with tachycardia and CHF.  atrial fib. Echo showed EF 45% (down from 50-55%) with mild to moderately reduced RV function and diastolic dysfunction. Diuresed and rate controlled. Rhtyhmol stopped. Started on Pradaxa and plans made for outpatient DC-CV in 4 weeks.   in september 2011was started on amiodarone.    He Continues with chronic LE edema which resolves overnight. He did smoke for 50 years but stopped in 2008. He has an occasional cough at nighttime . In the past, Diuretic does not seem to improve his lower extremity edema. No CP, orthopnea or PND.     Myoview 2006 EF 54%. no ischemia.     Carotid dopplers 8/10: 0-39% L 60-79% R  EKG shows normal sinus rhythm with rate 81 beats per minute, right bundle branch block   Outpatient Encounter Prescriptions as of 12/07/2010  Medication Sig Dispense Refill  . amiodarone (PACERONE) 200 MG tablet Take 1 tablet (200 mg total) by mouth daily.  30 tablet  5  . dabigatran (PRADAXA) 150 MG CAPS Take 1 capsule (150 mg total) by mouth 2 (two) times daily.  60 capsule  6  . dutasteride (AVODART) 0.5 MG capsule Take 0.5 mg by mouth 2 (two) times a week.        . gabapentin (NEURONTIN) 800 MG tablet Take 1/2 tablet by mouth four times daily  180 tablet  3  . glipiZIDE (GLUCOTROL) 10 MG tablet TAKE 1 TABLET BY MOUTH ONCE A DAY  90 tablet  2  . insulin glargine (LANTUS) 100 UNIT/ML injection Inject 35 units as directed each evening.        . Insulin Pen Needle 29G X MISC by Does not apply route.        . Insulin Syringes,  Disposable, U-100 0.3 ML MISC by Does not apply route.        . metFORMIN (GLUCOPHAGE) 500 MG tablet 1 1/2 tablets by mouth  2 times a day      . nitrofurantoin (MACRODANTIN) 100 MG capsule Take 100 mg by mouth at bedtime.        . pravastatin (PRAVACHOL) 20 MG tablet Take 20 mg by mouth daily.        . Tamsulosin HCl (FLOMAX) 0.4 MG CAPS Take by mouth daily.        .  furosemide (LASIX) 40 MG tablet Take 40 mg by mouth daily as needed.           Review of Systems  Constitutional: Negative.   HENT: Negative.   Eyes: Negative.   Respiratory: Positive for cough and shortness of breath.   Cardiovascular: Positive for leg swelling.  Gastrointestinal: Negative.   Musculoskeletal: Positive for gait problem.  Skin: Negative.   Neurological: Negative.   Hematological: Negative.   Psychiatric/Behavioral: Negative.   All other systems reviewed and are negative.    BP 90/54  Pulse 81  Resp 16  Ht 6' (1.829 m) Unable to obtain weight as in a wheelchair  Physical Exam  Nursing note and vitals reviewed. Constitutional: He is  oriented to person, place, and time. He appears well-developed and well-nourished.       Quadriplegia with upper extremity weakness.  HENT:  Head: Normocephalic.  Nose: Nose normal.  Mouth/Throat: Oropharynx is clear and moist.  Eyes: Conjunctivae are normal. Pupils are equal, round, and reactive to light.  Neck: Normal range of motion. Neck supple. No JVD present.  Cardiovascular: Normal rate, regular rhythm, S1 normal, S2 normal, normal heart sounds and intact distal pulses.  Exam reveals no gallop and no friction rub.   No murmur heard.      Pitting edema of the lower extremities to the mid shin, 1+  Pulmonary/Chest: Effort normal and breath sounds normal. No respiratory distress. He has no wheezes. He has no rales. He exhibits no tenderness.  Abdominal: Soft. Bowel sounds are normal. He exhibits no distension. There is no tenderness.  Musculoskeletal: Normal  range of motion. He exhibits no edema and no tenderness.  Lymphadenopathy:    He has no cervical adenopathy.  Neurological: He is alert and oriented to person, place, and time. Coordination normal.  Skin: Skin is warm and dry. No rash noted. No erythema.  Psychiatric: He has a normal mood and affect. His behavior is normal. Judgment and thought content normal.           Assessment and Plan

## 2011-09-24 NOTE — Assessment & Plan Note (Signed)
Dependent edema. We have recommended compression hose. He is unable to put these on. Also recommended leg elevation which he does.

## 2011-10-02 ENCOUNTER — Inpatient Hospital Stay (HOSPITAL_COMMUNITY): Payer: Medicare Other

## 2011-10-02 ENCOUNTER — Emergency Department (HOSPITAL_COMMUNITY): Payer: Medicare Other

## 2011-10-02 ENCOUNTER — Inpatient Hospital Stay (HOSPITAL_COMMUNITY)
Admission: EM | Admit: 2011-10-02 | Discharge: 2011-10-05 | DRG: 689 | Disposition: A | Discharge status: Home / Self Care | Payer: Medicare Other | Attending: Internal Medicine | Admitting: Internal Medicine

## 2011-10-02 ENCOUNTER — Encounter (HOSPITAL_COMMUNITY): Payer: Self-pay | Admitting: *Deleted

## 2011-10-02 DIAGNOSIS — R0989 Other specified symptoms and signs involving the circulatory and respiratory systems: Secondary | ICD-10-CM | POA: Diagnosis present

## 2011-10-02 DIAGNOSIS — J4489 Other specified chronic obstructive pulmonary disease: Secondary | ICD-10-CM | POA: Diagnosis present

## 2011-10-02 DIAGNOSIS — I6529 Occlusion and stenosis of unspecified carotid artery: Secondary | ICD-10-CM

## 2011-10-02 DIAGNOSIS — I4892 Unspecified atrial flutter: Secondary | ICD-10-CM

## 2011-10-02 DIAGNOSIS — R7881 Bacteremia: Secondary | ICD-10-CM | POA: Diagnosis present

## 2011-10-02 DIAGNOSIS — B961 Klebsiella pneumoniae [K. pneumoniae] as the cause of diseases classified elsewhere: Secondary | ICD-10-CM | POA: Diagnosis present

## 2011-10-02 DIAGNOSIS — W19XXXA Unspecified fall, initial encounter: Secondary | ICD-10-CM | POA: Diagnosis present

## 2011-10-02 DIAGNOSIS — E119 Type 2 diabetes mellitus without complications: Secondary | ICD-10-CM

## 2011-10-02 DIAGNOSIS — I452 Bifascicular block: Secondary | ICD-10-CM

## 2011-10-02 DIAGNOSIS — N390 Urinary tract infection, site not specified: Secondary | ICD-10-CM

## 2011-10-02 DIAGNOSIS — E875 Hyperkalemia: Secondary | ICD-10-CM

## 2011-10-02 DIAGNOSIS — J159 Unspecified bacterial pneumonia: Secondary | ICD-10-CM

## 2011-10-02 DIAGNOSIS — IMO0002 Reserved for concepts with insufficient information to code with codable children: Secondary | ICD-10-CM

## 2011-10-02 DIAGNOSIS — D638 Anemia in other chronic diseases classified elsewhere: Secondary | ICD-10-CM | POA: Diagnosis present

## 2011-10-02 DIAGNOSIS — M25519 Pain in unspecified shoulder: Secondary | ICD-10-CM

## 2011-10-02 DIAGNOSIS — H538 Other visual disturbances: Secondary | ICD-10-CM

## 2011-10-02 DIAGNOSIS — R339 Retention of urine, unspecified: Secondary | ICD-10-CM

## 2011-10-02 DIAGNOSIS — M542 Cervicalgia: Secondary | ICD-10-CM

## 2011-10-02 DIAGNOSIS — Z9181 History of falling: Secondary | ICD-10-CM

## 2011-10-02 DIAGNOSIS — D649 Anemia, unspecified: Secondary | ICD-10-CM | POA: Diagnosis present

## 2011-10-02 DIAGNOSIS — Z87891 Personal history of nicotine dependence: Secondary | ICD-10-CM

## 2011-10-02 DIAGNOSIS — J449 Chronic obstructive pulmonary disease, unspecified: Secondary | ICD-10-CM

## 2011-10-02 DIAGNOSIS — I5022 Chronic systolic (congestive) heart failure: Secondary | ICD-10-CM

## 2011-10-02 DIAGNOSIS — R600 Localized edema: Secondary | ICD-10-CM

## 2011-10-02 DIAGNOSIS — J40 Bronchitis, not specified as acute or chronic: Secondary | ICD-10-CM

## 2011-10-02 DIAGNOSIS — E871 Hypo-osmolality and hyponatremia: Secondary | ICD-10-CM | POA: Diagnosis present

## 2011-10-02 DIAGNOSIS — J189 Pneumonia, unspecified organism: Secondary | ICD-10-CM | POA: Diagnosis present

## 2011-10-02 DIAGNOSIS — R509 Fever, unspecified: Secondary | ICD-10-CM | POA: Diagnosis present

## 2011-10-02 DIAGNOSIS — I4891 Unspecified atrial fibrillation: Secondary | ICD-10-CM | POA: Diagnosis present

## 2011-10-02 DIAGNOSIS — E785 Hyperlipidemia, unspecified: Secondary | ICD-10-CM

## 2011-10-02 DIAGNOSIS — R809 Proteinuria, unspecified: Secondary | ICD-10-CM

## 2011-10-02 DIAGNOSIS — G8929 Other chronic pain: Secondary | ICD-10-CM

## 2011-10-02 LAB — DIFFERENTIAL
Basophils Absolute: 0 K/uL (ref 0.0–0.1)
Basophils Relative: 0 % (ref 0–1)
Eosinophils Absolute: 0 K/uL (ref 0.0–0.7)
Eosinophils Relative: 0 % (ref 0–5)
Lymphocytes Relative: 5 % — ABNORMAL LOW (ref 12–46)
Lymphs Abs: 0.3 K/uL — ABNORMAL LOW (ref 0.7–4.0)
Monocytes Absolute: 0.6 K/uL (ref 0.1–1.0)
Monocytes Relative: 8 % (ref 3–12)
Neutro Abs: 6.3 K/uL (ref 1.7–7.7)
Neutrophils Relative %: 87 % — ABNORMAL HIGH (ref 43–77)

## 2011-10-02 LAB — IRON AND TIBC
Saturation Ratios: 4 % — ABNORMAL LOW (ref 20–55)
UIBC: 279 ug/dL (ref 125–400)

## 2011-10-02 LAB — PROCALCITONIN: Procalcitonin: 0.67 ng/mL

## 2011-10-02 LAB — URINALYSIS, ROUTINE W REFLEX MICROSCOPIC
Bilirubin Urine: NEGATIVE
Glucose, UA: 100 mg/dL — AB
Nitrite: POSITIVE — AB
Protein, ur: 100 mg/dL — AB
Specific Gravity, Urine: 1.021 (ref 1.005–1.030)
Urobilinogen, UA: 1 mg/dL (ref 0.0–1.0)
pH: 5.5 (ref 5.0–8.0)

## 2011-10-02 LAB — COMPREHENSIVE METABOLIC PANEL
ALT: 17 U/L (ref 0–53)
CO2: 24 mEq/L (ref 19–32)
Calcium: 8.8 mg/dL (ref 8.4–10.5)
Creatinine, Ser: 1.21 mg/dL (ref 0.50–1.35)
GFR calc Af Amer: 68 mL/min — ABNORMAL LOW (ref >90)
GFR calc non Af Amer: 58 mL/min — ABNORMAL LOW (ref >90)
Glucose, Bld: 146 mg/dL — ABNORMAL HIGH (ref 70–99)

## 2011-10-02 LAB — CBC
HCT: 36.3 % — ABNORMAL LOW (ref 39.0–52.0)
Hemoglobin: 12 g/dL — ABNORMAL LOW (ref 13.0–17.0)
MCH: 31.3 pg (ref 26.0–34.0)
MCHC: 33.1 g/dL (ref 30.0–36.0)
MCV: 94.8 fL (ref 78.0–100.0)
Platelets: 199 K/uL (ref 150–400)
RBC: 3.83 MIL/uL — ABNORMAL LOW (ref 4.22–5.81)
RDW: 13.6 % (ref 11.5–15.5)
WBC: 7.2 K/uL (ref 4.0–10.5)

## 2011-10-02 LAB — GLUCOSE, CAPILLARY
Glucose-Capillary: 187 mg/dL — ABNORMAL HIGH (ref 70–99)
Glucose-Capillary: 266 mg/dL — ABNORMAL HIGH (ref 70–99)
Glucose-Capillary: 160 mg/dL — ABNORMAL HIGH (ref 70–99)

## 2011-10-02 LAB — URINE MICROSCOPIC-ADD ON

## 2011-10-02 LAB — RETICULOCYTES: RBC.: 3.66 MIL/uL — ABNORMAL LOW (ref 4.22–5.81)

## 2011-10-02 LAB — HEMOGLOBIN A1C
Hgb A1c MFr Bld: 7.2 % — ABNORMAL HIGH (ref <5.7)
Mean Plasma Glucose: 160 mg/dL — ABNORMAL HIGH (ref <117)

## 2011-10-02 LAB — FOLATE: Folate: 9.8 ng/mL

## 2011-10-02 MED ORDER — DEXTROSE 5 % IV SOLN
1.0000 g | INTRAVENOUS | Status: DC
  Administered 2011-10-03 – 2011-10-05 (×3): 1 g via INTRAVENOUS
  Filled 2011-10-02 (×4): qty 10

## 2011-10-02 MED ORDER — PREDNISONE 50 MG PO TABS: 60.0000 mg | ORAL_TABLET | Freq: Every day | ORAL | Status: DC

## 2011-10-02 MED ORDER — HYDROCODONE-ACETAMINOPHEN 5-325 MG PO TABS
1.0000 | ORAL_TABLET | ORAL | Status: DC | PRN
  Administered 2011-10-02 – 2011-10-03 (×4): 2 via ORAL
  Filled 2011-10-02 (×4): qty 2

## 2011-10-02 MED ORDER — INSULIN ASPART 100 UNIT/ML ~~LOC~~ SOLN
0.0000 [IU] | Freq: Every day | SUBCUTANEOUS | Status: DC
  Administered 2011-10-03: 5 [IU] via SUBCUTANEOUS
  Administered 2011-10-04: 3 [IU] via SUBCUTANEOUS

## 2011-10-02 MED ORDER — HYDROMORPHONE HCL PF 1 MG/ML IJ SOLN
0.5000 mg | INTRAMUSCULAR | Status: DC | PRN
  Administered 2011-10-02 – 2011-10-03 (×4): 0.5 mg via INTRAVENOUS
  Filled 2011-10-02 (×4): qty 1

## 2011-10-02 MED ORDER — FUROSEMIDE 10 MG/ML IJ SOLN
40.0000 mg | Freq: Every day | INTRAMUSCULAR | Status: DC
  Administered 2011-10-02 – 2011-10-03 (×2): 40 mg via INTRAVENOUS
  Filled 2011-10-02 (×2): qty 4

## 2011-10-02 MED ORDER — IPRATROPIUM BROMIDE 0.02 % IN SOLN
0.5000 mg | Freq: Once | RESPIRATORY_TRACT | Status: DC
  Filled 2011-10-02 (×2): qty 2.5

## 2011-10-02 MED ORDER — ALBUTEROL SULFATE (5 MG/ML) 0.5% IN NEBU
2.5000 mg | INHALATION_SOLUTION | Freq: Four times a day (QID) | RESPIRATORY_TRACT | Status: DC
  Filled 2011-10-02: qty 0.5

## 2011-10-02 MED ORDER — ONDANSETRON HCL 4 MG/2ML IJ SOLN: 4.0000 mg | Freq: Four times a day (QID) | INTRAMUSCULAR | Status: DC | PRN

## 2011-10-02 MED ORDER — SODIUM CHLORIDE 0.9 % IV SOLN
INTRAVENOUS | Status: AC
  Administered 2011-10-02: 07:00:00 via INTRAVENOUS

## 2011-10-02 MED ORDER — ACETAMINOPHEN 325 MG PO TABS: 650.0000 mg | ORAL_TABLET | Freq: Four times a day (QID) | ORAL | Status: DC | PRN

## 2011-10-02 MED ORDER — ALBUTEROL SULFATE (5 MG/ML) 0.5% IN NEBU
2.5000 mg | INHALATION_SOLUTION | Freq: Four times a day (QID) | RESPIRATORY_TRACT | Status: DC | PRN
  Administered 2011-10-02: 2.5 mg via RESPIRATORY_TRACT

## 2011-10-02 MED ORDER — INSULIN ASPART 100 UNIT/ML ~~LOC~~ SOLN
0.0000 [IU] | Freq: Three times a day (TID) | SUBCUTANEOUS | Status: DC
  Administered 2011-10-02: 5 [IU] via SUBCUTANEOUS
  Administered 2011-10-02: 2 [IU] via SUBCUTANEOUS
  Administered 2011-10-03: 5 [IU] via SUBCUTANEOUS
  Administered 2011-10-03: 2 [IU] via SUBCUTANEOUS
  Administered 2011-10-03: 5 [IU] via SUBCUTANEOUS
  Administered 2011-10-04: 2 [IU] via SUBCUTANEOUS
  Administered 2011-10-04: 1 [IU] via SUBCUTANEOUS
  Administered 2011-10-04: 5 [IU] via SUBCUTANEOUS
  Administered 2011-10-05: 1 [IU] via SUBCUTANEOUS
  Administered 2011-10-05: 3 [IU] via SUBCUTANEOUS
  Filled 2011-10-02: qty 1

## 2011-10-02 MED ORDER — FENTANYL CITRATE 0.05 MG/ML IJ SOLN
50.0000 ug | Freq: Once | INTRAMUSCULAR | Status: AC
  Administered 2011-10-02: 50 ug via INTRAVENOUS
  Filled 2011-10-02 (×2): qty 2

## 2011-10-02 MED ORDER — ONDANSETRON HCL 4 MG PO TABS: 4.0000 mg | ORAL_TABLET | Freq: Four times a day (QID) | ORAL | Status: DC | PRN

## 2011-10-02 MED ORDER — SODIUM CHLORIDE 0.9 % IJ SOLN
3.0000 mL | Freq: Two times a day (BID) | INTRAMUSCULAR | Status: DC
  Administered 2011-10-03 – 2011-10-04 (×4): 3 mL via INTRAVENOUS

## 2011-10-02 MED ORDER — DUTASTERIDE 0.5 MG PO CAPS
0.5000 mg | ORAL_CAPSULE | ORAL | Status: DC
  Administered 2011-10-04: 0.5 mg via ORAL
  Filled 2011-10-02 (×2): qty 1

## 2011-10-02 MED ORDER — ARTIFICIAL TEARS OP OINT
TOPICAL_OINTMENT | OPHTHALMIC | Status: DC | PRN
  Administered 2011-10-02: 06:00:00 via OPHTHALMIC
  Administered 2011-10-03: 1 via OPHTHALMIC
  Filled 2011-10-02: qty 3.5

## 2011-10-02 MED ORDER — ALBUTEROL SULFATE (5 MG/ML) 0.5% IN NEBU
5.0000 mg | INHALATION_SOLUTION | Freq: Once | RESPIRATORY_TRACT | Status: DC
  Filled 2011-10-02: qty 0.5

## 2011-10-02 MED ORDER — SODIUM CHLORIDE 0.9 % IV SOLN
INTRAVENOUS | Status: DC
  Administered 2011-10-02: 03:00:00 via INTRAVENOUS

## 2011-10-02 MED ORDER — ALBUTEROL SULFATE (5 MG/ML) 0.5% IN NEBU: 2.5000 mg | INHALATION_SOLUTION | RESPIRATORY_TRACT | Status: AC | PRN

## 2011-10-02 MED ORDER — DEXTROSE 5 % IV SOLN
500.0000 mg | INTRAVENOUS | Status: DC
  Administered 2011-10-02 – 2011-10-05 (×4): 500 mg via INTRAVENOUS
  Filled 2011-10-02 (×5): qty 500

## 2011-10-02 MED ORDER — INSULIN GLARGINE 100 UNIT/ML ~~LOC~~ SOLN
20.0000 [IU] | Freq: Every day | SUBCUTANEOUS | Status: DC
  Administered 2011-10-03 – 2011-10-04 (×3): 20 [IU] via SUBCUTANEOUS

## 2011-10-02 MED ORDER — CLOTRIMAZOLE 1 % EX CREA
TOPICAL_CREAM | Freq: Two times a day (BID) | CUTANEOUS | Status: DC
  Administered 2011-10-02 (×2): via TOPICAL
  Administered 2011-10-03 (×2): 1 via TOPICAL
  Administered 2011-10-03 – 2011-10-04 (×3): via TOPICAL
  Administered 2011-10-05: 1 via TOPICAL
  Filled 2011-10-02: qty 15

## 2011-10-02 MED ORDER — TAMSULOSIN HCL 0.4 MG PO CAPS
0.4000 mg | ORAL_CAPSULE | Freq: Every day | ORAL | Status: DC
  Administered 2011-10-02 – 2011-10-05 (×4): 0.4 mg via ORAL
  Filled 2011-10-02 (×4): qty 1

## 2011-10-02 MED ORDER — AMIODARONE HCL 200 MG PO TABS
200.0000 mg | ORAL_TABLET | Freq: Every day | ORAL | Status: DC
  Administered 2011-10-02 – 2011-10-05 (×4): 200 mg via ORAL
  Filled 2011-10-02 (×4): qty 1

## 2011-10-02 MED ORDER — DEXTROSE 5 % IV SOLN
1.0000 g | Freq: Once | INTRAVENOUS | Status: AC
  Administered 2011-10-02: 1 g via INTRAVENOUS
  Filled 2011-10-02: qty 10

## 2011-10-02 MED ORDER — ACETAMINOPHEN 650 MG RE SUPP: 650.0000 mg | Freq: Four times a day (QID) | RECTAL | Status: DC | PRN

## 2011-10-02 MED ORDER — DABIGATRAN ETEXILATE MESYLATE 150 MG PO CAPS
150.0000 mg | ORAL_CAPSULE | Freq: Two times a day (BID) | ORAL | Status: DC
  Filled 2011-10-02 (×2): qty 1

## 2011-10-02 MED ORDER — DEXTROSE 5 % IV SOLN
500.0000 mg | Freq: Once | INTRAVENOUS | Status: AC
  Administered 2011-10-02: 500 mg via INTRAVENOUS
  Filled 2011-10-02: qty 500

## 2011-10-02 MED ORDER — PREDNISONE 20 MG PO TABS
40.0000 mg | ORAL_TABLET | Freq: Every day | ORAL | Status: AC
  Administered 2011-10-03 – 2011-10-05 (×3): 40 mg via ORAL
  Filled 2011-10-02 (×4): qty 2

## 2011-10-02 MED ORDER — GABAPENTIN 800 MG PO TABS
800.0000 mg | ORAL_TABLET | Freq: Two times a day (BID) | ORAL | Status: DC
  Administered 2011-10-03 (×2): 800 mg via ORAL
  Filled 2011-10-02 (×3): qty 1

## 2011-10-02 MED ORDER — SIMVASTATIN 20 MG PO TABS
20.0000 mg | ORAL_TABLET | Freq: Every day | ORAL | Status: DC
  Administered 2011-10-02 – 2011-10-04 (×3): 20 mg via ORAL
  Filled 2011-10-02 (×4): qty 1

## 2011-10-02 MED ORDER — PROMETHAZINE HCL 25 MG/ML IJ SOLN
12.5000 mg | Freq: Once | INTRAMUSCULAR | Status: AC
  Administered 2011-10-02: 12.5 mg via INTRAVENOUS
  Filled 2011-10-02: qty 1

## 2011-10-02 MED ORDER — DABIGATRAN ETEXILATE MESYLATE 150 MG PO CAPS
150.0000 mg | ORAL_CAPSULE | Freq: Two times a day (BID) | ORAL | Status: DC
  Administered 2011-10-02 – 2011-10-05 (×6): 150 mg via ORAL
  Filled 2011-10-02 (×7): qty 1

## 2011-10-02 MED ORDER — ACETAMINOPHEN 325 MG PO TABS
650.0000 mg | ORAL_TABLET | Freq: Once | ORAL | Status: AC
  Administered 2011-10-02: 650 mg via ORAL
  Filled 2011-10-02: qty 2

## 2011-10-02 NOTE — ED Notes (Signed)
Pt has been feeling generally ill since this am.  Pt has been having generalized weakness, fever of up to 103F.  Pt had one episode of diarrhea this evening and has had urgency and foul smelling urine (hx of UTI)

## 2011-10-02 NOTE — ED Provider Notes (Addendum)
History     CSN: [id number]  Arrival date & time 10/02/11  0136   First MD Initiated Contact with Patient 10/02/11 0210      Chief Complaint  Patient presents with  . Fever    (Consider location/radiation/quality/duration/timing/severity/associated sxs/prior treatment) HPI This is a 71 year old white male with a history of paraplegia. He has been feeling generally ill since yesterday morning. He has had a fever and malaise. His fever was noted to be 103.4 arrival here. He states his been coughing for about 10 days and is congested in his lungs. He has chronic edema of the lower extremities right greater than left which is less prominent than usual. He had an episode of diarrhea earlier and has had urinary urgency and foul smelling urine. A Foley catheter was placed by nursing staff on arrival and he was given acetaminophen for his fever.   Past Medical History  Diagnosis Date  . Atrial fibrillation   . COPD (chronic obstructive pulmonary disease)   . Diabetes mellitus   . Hyperlipidemia   . Spinal cord injury     wheelchair bound  . Neurogenic bladder     occ infections  . Carotid stenosis   . Chronic UTI     Past Surgical History  Procedure Date  . Eye surgery     cataract extraction  . Orchiectomy     left, secondary to torsion  . X-stop implantation 1998    decompression, spinal surgery 04/2003  . Cervical spine surgery     bone graft  . Cardiac catheterization 1992  . Cervical discectomy [id number]  . Spinal cord decompression     C4    Family History  Problem Relation Age of Onset  . Stroke Mother   . Alzheimer's disease Mother     History  Substance Use Topics  . Smoking status: Former Smoker    Quit date: 04/16/2006  . Smokeless tobacco: Not on file  . Alcohol Use: No      Review of Systems  All other systems reviewed and are negative.    Allergies  Quinidine  Home Medications   Current Outpatient Rx  Name Route Sig Dispense Refill  .  AMIODARONE HCL 200 MG PO TABS Oral Take 1 tablet (200 mg total) by mouth daily. 30 tablet 3    Must schedule follow up for more refills [phone] ...  . DABIGATRAN ETEXILATE MESYLATE 150 MG PO CAPS Oral Take 1 capsule (150 mg total) by mouth 2 (two) times daily. 60 capsule 3    Must schedule follow up for more refills [phone] ...  . DUTASTERIDE 0.5 MG PO CAPS Oral Take 0.5 mg by mouth 2 (two) times a week.      . FUROSEMIDE 40 MG PO TABS Oral Take 1 tablet (40 mg total) by mouth 2 (two) times daily as needed. 60 tablet 6  . GABAPENTIN 800 MG PO TABS  Take 1/2 tablet by mouth four times daily 180 tablet 3  . GLIPIZIDE 10 MG PO TABS Oral Take 1 tablet (10 mg total) by mouth daily. 90 tablet 2  . INSULIN GLARGINE 100 UNIT/ML [place] SOLN Subcutaneous Inject 20-30 Units into the skin at bedtime.    Marland Kitchen METFORMIN HCL 500 MG PO TABS Oral Take 750 mg by mouth 2 (two) times daily with a meal.    . NITROFURANTOIN MACROCRYSTAL 100 MG PO CAPS Oral Take 100 mg by mouth at bedtime.      Marland Kitchen PRAVASTATIN  SODIUM 20 MG PO TABS Oral Take 1 tablet (20 mg total) by mouth daily. 90 tablet 3  . TAMSULOSIN HCL 0.4 MG PO CAPS Oral Take by mouth daily.        BP 114/55  Pulse 80  Temp 99.7 F (37.6 C) (Oral)  Resp 16  SpO2 95%  Physical Exam General: Well-developed, well-nourished male in no acute distress; appearance consistent with age of record HENT: normocephalic, atraumatic Eyes: pupils equal round and reactive to light; extraocular muscles intact Neck: supple Heart: regular rate and rhythm Lungs: Decreased air movement bilaterally with wheezing on inspiration and expiration Abdomen: soft; nondistended; nontender; no masses or hepatosplenomegaly; bowel sounds present GU: Surgical absence of left testicle; tinea cruris; Foley catheter draining clear straw-colored urine Extremities: No deformity; full range of motion; pitting edema of lower extremities, right greater than left Neurologic: Awake, alert;  paraplegic; no facial droop Skin: Warm and dry;      ED Course  Procedures (including critical care time)     MDM   Nursing notes and vitals signs, including pulse oximetry, reviewed.  Summary of this visit's results, reviewed by myself:  Labs:  Results for orders placed during the hospital encounter of 10/02/11  CBC      Component Value Range   WBC 7.2  4.0 - 10.5 K/uL   RBC 3.83 (*) 4.22 - 5.81 MIL/uL   Hemoglobin 12.0 (*) 13.0 - 17.0 g/dL   HCT 16.1 (*) 09.6 - 04.5 %   MCV 94.8  78.0 - 100.0 fL   MCH 31.3  26.0 - 34.0 pg   MCHC 33.1  30.0 - 36.0 g/dL   RDW 40.9  81.1 - 91.4 %   Platelets 199  150 - 400 K/uL  DIFFERENTIAL      Component Value Range   Neutrophils Relative 87 (*) 43 - 77 %   Neutro Abs 6.3  1.7 - 7.7 K/uL   Lymphocytes Relative 5 (*) 12 - 46 %   Lymphs Abs 0.3 (*) 0.7 - 4.0 K/uL   Monocytes Relative 8  3 - 12 %   Monocytes Absolute 0.6  0.1 - 1.0 K/uL   Eosinophils Relative 0  0 - 5 %   Eosinophils Absolute 0.0  0.0 - 0.7 K/uL   Basophils Relative 0  0 - 1 %   Basophils Absolute 0.0  0.0 - 0.1 K/uL  COMPREHENSIVE METABOLIC PANEL      Component Value Range   Sodium 133 (*) 135 - 145 mEq/L   Potassium 3.8  3.5 - 5.1 mEq/L   Chloride 99  96 - 112 mEq/L   CO2 24  19 - 32 mEq/L   Glucose, Bld 146 (*) 70 - 99 mg/dL   BUN 23  6 - 23 mg/dL   Creatinine, Ser 7.82  0.50 - 1.35 mg/dL   Calcium 8.8  8.4 - 95.6 mg/dL   Total Protein 6.9  6.0 - 8.3 g/dL   Albumin 3.3 (*) 3.5 - 5.2 g/dL   AST 22  0 - 37 U/L   ALT 17  0 - 53 U/L   Alkaline Phosphatase 61  39 - 117 U/L   Total Bilirubin 0.4  0.3 - 1.2 mg/dL   GFR calc non Af Amer 58 (*) >90 mL/min   GFR calc Af Amer 68 (*) >90 mL/min  URINALYSIS, ROUTINE W REFLEX MICROSCOPIC      Component Value Range   Color, Urine YELLOW  YELLOW   APPearance CLOUDY (*)  CLEAR   Specific Gravity, Urine 1.021  1.005 - 1.030   pH 5.5  5.0 - 8.0   Glucose, UA 100 (*) NEGATIVE mg/dL   Hgb urine dipstick LARGE (*)  NEGATIVE   Bilirubin Urine NEGATIVE  NEGATIVE   Ketones, ur TRACE (*) NEGATIVE mg/dL   Protein, ur 191 (*) NEGATIVE mg/dL   Urobilinogen, UA 1.0  0.0 - 1.0 mg/dL   Nitrite POSITIVE (*) NEGATIVE   Leukocytes, UA MODERATE (*) NEGATIVE  PROCALCITONIN      Component Value Range   Procalcitonin 0.67    LACTIC ACID, PLASMA      Component Value Range   Lactic Acid, Venous 1.3  0.5 - 2.2 mmol/L  URINE MICROSCOPIC-ADD ON      Component Value Range   WBC, UA 11-20  <3 WBC/hpf   RBC / HPF 3-6  <3 RBC/hpf   Bacteria, UA MANY (*) RARE   Urine-Other MUCOUS PRESENT      Imaging Studies: Dg Chest Port 1 View  10/02/2011  *RADIOLOGY REPORT*  Clinical Data: Fever and weakness.  PORTABLE CHEST - 1 VIEW  Comparison: None.  Findings: The lungs are well-aerated.  Vascular congestion is noted, with increased interstitial markings.  This may reflect mild interstitial edema or possibly pneumonia, given clinical concern. There is no evidence of pleural effusion or pneumothorax.  The cardiomediastinal silhouette is borderline enlarged.  No acute osseous abnormalities are seen.  Cervical spinal fusion hardware is partially imaged.  IMPRESSION: Vascular congestion and borderline cardiomegaly, with increased interstitial markings; this may reflect mild interstitial edema or possibly pneumonia, given clinical concern.  Original Report Authenticated By: Tonia Ghent, M.D.   5:49 AM Left patient admitted for urinary tract infection and bronchitis. Chest x-ray may indicate an early pneumonia. Patient has been given Rocephin and Zithromax for this. Patient states he has no history of COPD but this is listed as the past medical condition. We will start albuterol and Atrovent treatments for his wheezing.  6:25 AM Phenergan 12.5 mg IV ordered for nausea and vomiting. [facility] hospitalist to admit. Holding orders written.          Hanley Seamen, MD 10/02/11 4782  Hanley Seamen, MD 10/02/11 [phone]

## 2011-10-02 NOTE — H&P (Signed)
[facility] History and Physical  Jose Murray WUJ:[id number] DOB: [birth year] DOA: 10/02/2011   PCP: Roxy Manns, MD   Chief Complaint: fever , chills since yesteday.   HPI:  71 year old male with h/o paraplegia lives at home with his son, was brought to the hospital for fever, chills since yesterday. On arrival to ED he was found t have a temp of 103 F, UTI and early Pneumonia. He and his friend at bedside also reports multiple falls at home over the last few weeks, with generalized weakness. He denies chest pain or palpitations or syncope. He reports DOE, with productive yellow colored sputum. He reports nausea, dry retching and loose stools yesterday. He denies any headache or blurry vision. He reports neck pain following multiple falls. He reports he has chronic pain syndrome from spinal cord injury years ago, but his neck, shoulder pain are worse today. He has h/o of BPH and chronic UTI'S and is on macrodantin. He is being admitted for management of UTI and pneumonia and will further evaluate him for recurrent falls.   Review of Systems:  See HPI otherwise negative.   Past Medical History  Diagnosis Date  . Atrial fibrillation   . COPD (chronic obstructive pulmonary disease)   . Diabetes mellitus   . Hyperlipidemia   . Spinal cord injury     wheelchair bound  . Neurogenic bladder     occ infections  . Carotid stenosis   . Chronic UTI    Past Surgical History  Procedure Date  . Eye surgery     cataract extraction  . Orchiectomy     left, secondary to torsion  . X-stop implantation 1998    decompression, spinal surgery 04/2003  . Cervical spine surgery     bone graft  . Cardiac catheterization 1992  . Cervical discectomy [id number]  . Spinal cord decompression     C4   Social History:  reports that he quit smoking about 5 years ago. He does not have any smokeless tobacco history on file. He reports that he does not drink alcohol or use illicit drugs.  Allergies    Allergen Reactions  . Quinidine     Family History  Problem Relation Age of Onset  . Stroke Mother   . Alzheimer's disease Mother     Prior to Admission medications   Medication Sig Start Date End Date Taking? Authorizing Provider  amiodarone (PACERONE) 200 MG tablet Take 1 tablet (200 mg total) by mouth daily. 09/11/11  Yes Antonieta Iba, MD  dabigatran (PRADAXA) 150 MG CAPS Take 1 capsule (150 mg total) by mouth 2 (two) times daily. 09/11/11  Yes Antonieta Iba, MD  dutasteride (AVODART) 0.5 MG capsule Take 0.5 mg by mouth 2 (two) times a week.     Yes Historical Provider, MD  furosemide (LASIX) 40 MG tablet Take 1 tablet (40 mg total) by mouth 2 (two) times daily as needed. 12/07/10  Yes Antonieta Iba, MD  gabapentin (NEURONTIN) 800 MG tablet Take 1/2 tablet by mouth four times daily 11/13/10  Yes Judy Pimple, MD  glipiZIDE (GLUCOTROL) 10 MG tablet Take 1 tablet (10 mg total) by mouth daily. 05/07/11  Yes Marne A Tower, MD  insulin glargine (LANTUS) 100 UNIT/ML injection Inject 20-30 Units into the skin at bedtime.   Yes Historical Provider, MD  metFORMIN (GLUCOPHAGE) 500 MG tablet Take 750 mg by mouth 2 (two) times daily with a meal.   Yes Historical Provider, MD  nitrofurantoin (MACRODANTIN) 100 MG capsule Take 100 mg by mouth at bedtime.     Yes Historical Provider, MD  pravastatin (PRAVACHOL) 20 MG tablet Take 1 tablet (20 mg total) by mouth daily. 02/19/11  Yes Judy Pimple, MD  Tamsulosin HCl (FLOMAX) 0.4 MG CAPS Take by mouth daily.     Yes Historical Provider, MD   Physical Exam: Filed Vitals:   10/02/11 0257 10/02/11 0345 10/02/11 0500 10/02/11 0638  BP: 102/46 101/55 114/55   Pulse: 85 83 80   Temp: 98.7 F (37.1 C)  99.7 F (37.6 C) 100.2 F (37.9 C)  TempSrc: Oral  Oral Oral  Resp: 16   20  SpO2: 93% 92% 95% 96%  Constitutional: Vital signs reviewed.  Patient is a well-developed and well-nourished in mild acute distress and cooperative with exam. Alert and  oriented x3.  Head: Normocephalic and atraumatic Mouth: no erythema or exudates, MMM Eyes: PERRL, EOMI, conjunctivae normal, No scleral icterus.  Neck: Supple, Trachea midline normal ROM, No JVD, mass, cervical spine tenderness present.  Cardiovascular: RRR, S1 normal, S2 normal, no MRG, pulses symmetric and intact bilaterally Pulmonary/Chest: CTAB, no , rales, or rhonchi, SCATTERED wheezing heard over the left side.  Abdominal: Soft. Non-tender, non-distended, bowel sounds are normal, no masses, organomegaly, or guarding present.  Musculoskeletal: bilateral lower extremity chronic changes. Trace edema.  Neurological: A&O x3, paraplegic. Cranial nerves unremarkable. .  Skin: Warm, dry and intact. No rash, cyanosis, or clubbing.  Psychiatric: Normal mood and affect.   Labs on Admission:  Basic Metabolic Panel:  Lab 10/02/11 1478  NA 133*  K 3.8  CL 99  CO2 24  GLUCOSE 146*  BUN 23  CREATININE 1.21  CALCIUM 8.8  MG --  PHOS --   Liver Function Tests:  Lab 10/02/11 0245  AST 22  ALT 17  ALKPHOS 61  BILITOT 0.4  PROT 6.9  ALBUMIN 3.3*   No results found for this basename: LIPASE:5,AMYLASE:5 in the last 168 hours No results found for this basename: AMMONIA:5 in the last 168 hours CBC:  Lab 10/02/11 0245  WBC 7.2  NEUTROABS 6.3  HGB 12.0*  HCT 36.3*  MCV 94.8  PLT 199   Cardiac Enzymes: No results found for this basename: CKTOTAL:5,CKMB:5,CKMBINDEX:5,TROPONINI:5 in the last 168 hours BNP: No components found with this basename: POCBNP:5 CBG: No results found for this basename: GLUCAP:5 in the last 168 hours  Radiological Exams on Admission: Dg Chest Port 1 View  10/02/2011  *RADIOLOGY REPORT*  Clinical Data: Fever and weakness.  PORTABLE CHEST - 1 VIEW  Comparison: None.  Findings: The lungs are well-aerated.  Vascular congestion is noted, with increased interstitial markings.  This may reflect mild interstitial edema or possibly pneumonia, given clinical concern.  There is no evidence of pleural effusion or pneumothorax.  The cardiomediastinal silhouette is borderline enlarged.  No acute osseous abnormalities are seen.  Cervical spinal fusion hardware is partially imaged.  IMPRESSION: Vascular congestion and borderline cardiomegaly, with increased interstitial markings; this may reflect mild interstitial edema or possibly pneumonia, given clinical concern.  Original Report Authenticated By: Tonia Ghent, M.D.    EKG: pending.  Assessment/Plan    1. Fever: probably secondary to UTI and early pneumonia. Patient received IV rocephin and zithromax . Will continue the same. Will get urine legionella and streptococcal antigen. Sputum cultures and blood cultures, urine cultures are already drawn. Will repeat CXR in 48 hours for resolution of the abnormal findings. Nasal oxygen as needed.  2.  Pulmonary vascular congestion/ Use of lasix  At home: patient is not aware of any heart failure. Will get a pro bnp, and @ D echocardiogram for further evaluation. Start lasix iv 40mg  daily and monitor renal function.  3. COPD: wheezing heard on the left side. Will give a dose of prednisone and do a quick taper in the next few days. Continue with nebs as needed.  4. Hyponatremia: mild. Continue to monitor 5. Anemia : normocytic. Will get an anemia panel. 6. Multiple falls: no syncope as per the patient. Probably secondary to deconditioning. Will get a CT head without contrast and x ray of the cervical spine for worsening neck pain. These fall put him at risk for bleeding, as he is on pradaxa.  7. Atrial Fibrillation: rate controlled on amiodarone and pradaxa.  8. Diabetes Mellitus: on metformin and amaryl , and lantus at home. Held oral medications, restarted lantus and SSI, Will get hgba1c and monitor.  9. DVT prophylaxis: on pradaxa  Code Status: FULL CODE. Family Communication: discussed with family and the patient on the plan of care at bedside Disposition Plan: pending  PT/OT Evaluation.  Kathlen Mody, MD  [facility] Pager [phone]  If 7PM-7AM, please contact night-coverage [email or web address] Password [facility] 10/02/2011, 8:18 AM

## 2011-10-02 NOTE — ED Notes (Signed)
Pt is paraplegic post spinal cord injury after a fall

## 2011-10-02 NOTE — ED Notes (Signed)
CBG- 266 

## 2011-10-03 ENCOUNTER — Inpatient Hospital Stay (HOSPITAL_COMMUNITY): Payer: Medicare Other

## 2011-10-03 DIAGNOSIS — J159 Unspecified bacterial pneumonia: Secondary | ICD-10-CM

## 2011-10-03 DIAGNOSIS — E119 Type 2 diabetes mellitus without complications: Secondary | ICD-10-CM

## 2011-10-03 DIAGNOSIS — N390 Urinary tract infection, site not specified: Secondary | ICD-10-CM

## 2011-10-03 DIAGNOSIS — M542 Cervicalgia: Secondary | ICD-10-CM

## 2011-10-03 LAB — LEGIONELLA ANTIGEN, URINE: Legionella Antigen, Urine: NEGATIVE

## 2011-10-03 LAB — GLUCOSE, CAPILLARY
Glucose-Capillary: 199 mg/dL — ABNORMAL HIGH (ref 70–99)
Glucose-Capillary: 285 mg/dL — ABNORMAL HIGH (ref 70–99)
Glucose-Capillary: 334 mg/dL — ABNORMAL HIGH (ref 70–99)
Glucose-Capillary: 363 mg/dL — ABNORMAL HIGH (ref 70–99)

## 2011-10-03 LAB — CBC
HCT: 33.6 % — ABNORMAL LOW (ref 39.0–52.0)
MCV: 96.3 fL (ref 78.0–100.0)
RBC: 3.49 MIL/uL — ABNORMAL LOW (ref 4.22–5.81)
WBC: 6.5 10*3/uL (ref 4.0–10.5)

## 2011-10-03 LAB — COMPREHENSIVE METABOLIC PANEL
Albumin: 2.6 g/dL — ABNORMAL LOW (ref 3.5–5.2)
BUN: 22 mg/dL (ref 6–23)
Creatinine, Ser: 1.22 mg/dL (ref 0.50–1.35)
Potassium: 3.9 mEq/L (ref 3.5–5.1)
Total Protein: 6.3 g/dL (ref 6.0–8.3)

## 2011-10-03 LAB — APTT: aPTT: 80 seconds — ABNORMAL HIGH (ref 24–37)

## 2011-10-03 MED ORDER — GABAPENTIN 600 MG PO TABS
600.0000 mg | ORAL_TABLET | Freq: Three times a day (TID) | ORAL | Status: DC
  Administered 2011-10-03 – 2011-10-04 (×5): 600 mg via ORAL
  Filled 2011-10-03 (×8): qty 1

## 2011-10-03 MED ORDER — TRAMADOL HCL 50 MG PO TABS
50.0000 mg | ORAL_TABLET | Freq: Four times a day (QID) | ORAL | Status: DC | PRN
  Administered 2011-10-04: 50 mg via ORAL
  Filled 2011-10-03: qty 1

## 2011-10-03 MED ORDER — LORAZEPAM 0.5 MG PO TABS
0.2500 mg | ORAL_TABLET | Freq: Two times a day (BID) | ORAL | Status: DC | PRN
  Administered 2011-10-04: 0.25 mg via ORAL
  Filled 2011-10-03: qty 1

## 2011-10-03 NOTE — Progress Notes (Signed)
Subjective: Afebrile; feeling better. Complaining of some neck discomfort and mild anxiety at nights, but overall better than before. Denies CP, abdominal pain , nausea, vomiting or any other complaints.  Objective: Vital signs in last 24 hours: Temp:  [97.8 F (36.6 C)-98.1 F (36.7 C)] 98.1 F (36.7 C) (06/19 0436) Pulse Rate:  [72-89] 76  (06/19 0436) Resp:  [20-24] 20  (06/19 0436) BP: (96-134)/(61-71) 127/71 mmHg (06/19 0436) SpO2:  [93 %-97 %] 97 % (06/19 0436) Weight:  [90.3 kg (199 lb 1.2 oz)] 90.3 kg (199 lb 1.2 oz) (06/18 1452) Weight change:  Last BM Date: 10/02/11  Intake/Output from previous day: 06/18 0701 - 06/19 0700 In: 1140 [P.O.:840; I.V.:250; IV Piggyback:50] Out: 875 [Urine:875] Total I/O In: 240 [P.O.:240] Out: -    Physical Exam: General: Alert, awake, oriented x3, in no acute distress. HEENT: No bruits, no goiter. Heart: s1 and S2; no rubs or gallops Lungs: scattered rhonchi; otherwise clear Abdomen: Soft, nontender, nondistended, positive bowel sounds. Extremities: No clubbing or cyanosis; trace to 1+edema bilaterally. Neuro: No new deficit.    Lab Results: Basic Metabolic Panel:  Basename 10/03/11 0420 10/02/11 0245  NA 133* 133*  K 3.9 3.8  CL 100 99  CO2 25 24  GLUCOSE 220* 146*  BUN 22 23  CREATININE 1.22 1.21  CALCIUM 8.7 8.8  MG -- --  PHOS -- --   Liver Function Tests:  Basename 10/03/11 0420 10/02/11 0245  AST 24 22  ALT 22 17  ALKPHOS 50 61  BILITOT 0.2* 0.4  PROT 6.3 6.9  ALBUMIN 2.6* 3.3*   CBC:  Basename 10/03/11 0420 10/02/11 0245  WBC 6.5 7.2  NEUTROABS -- 6.3  HGB 10.8* 12.0*  HCT 33.6* 36.3*  MCV 96.3 94.8  PLT 172 199   BNP:  Basename 10/02/11 0245  PROBNP 2045.0*   CBG:  Basename 10/03/11 1016 10/03/11 0651 10/02/11 2046 10/02/11 1727 10/02/11 1255 10/02/11 0900  GLUCAP 199* 158* 170* 160* 266* 187*   Hemoglobin A1C:  Basename 10/02/11 0836  HGBA1C 7.2*   Thyroid Function  Tests:  Basename 10/02/11 1010  TSH 0.490  T4TOTAL --  FREET4 --  T3FREE --  THYROIDAB --   Anemia Panel:  Basename 10/02/11 1010  VITAMINB12 350  FOLATE 9.8  FERRITIN 105  TIBC 290  IRON 11*  RETICCTPCT 1.3   Coagulation:  Basename 10/03/11 0420  LABPROT 18.1*  INR 1.47   Urinalysis:  Basename 10/02/11 0158  COLORURINE YELLOW  LABSPEC 1.021  PHURINE 5.5  GLUCOSEU 100*  HGBUR LARGE*  BILIRUBINUR NEGATIVE  KETONESUR TRACE*  PROTEINUR 100*  UROBILINOGEN 1.0  NITRITE POSITIVE*  LEUKOCYTESUR MODERATE*   Misc. Labs:  Recent Results (from the past 240 hour(s))  URINE CULTURE     Status: Normal (Preliminary result)   Collection Time   10/02/11  1:58 AM      Component Value Range Status Comment   Specimen Description URINE, CATHETERIZED   Final    Special Requests NONE   Final    Culture  Setup Time [id number]   Final    Colony Count PENDING   Incomplete    Culture Culture reincubated for better growth   Final    Report Status PENDING   Incomplete   CULTURE, BLOOD (ROUTINE X 2)     Status: Normal (Preliminary result)   Collection Time   10/02/11  2:45 AM      Component Value Range Status Comment   Specimen Description BLOOD LEFT WRIST  Final    Special Requests BOTTLES DRAWN AEROBIC AND ANAEROBIC 5CC   Final    Culture  Setup Time [id number]   Final    Culture     Final    Value:        BLOOD CULTURE RECEIVED NO GROWTH TO DATE CULTURE WILL BE HELD FOR 5 DAYS BEFORE ISSUING A FINAL NEGATIVE REPORT   Report Status PENDING   Incomplete   CULTURE, BLOOD (ROUTINE X 2)     Status: Normal (Preliminary result)   Collection Time   10/02/11  3:40 AM      Component Value Range Status Comment   Specimen Description BLOOD LEFT FOREARM   Final    Special Requests BOTTLES DRAWN AEROBIC AND ANAEROBIC 5CC   Final    Culture  Setup Time [id number]   Final    Culture     Final    Value:        BLOOD CULTURE RECEIVED NO GROWTH TO DATE CULTURE WILL BE HELD FOR 5 DAYS  BEFORE ISSUING A FINAL NEGATIVE REPORT   Report Status PENDING   Incomplete     Studies/Results: Dg Cervical Spine 2-3 Views  10/02/2011  *RADIOLOGY REPORT*  Clinical Data: 71 year old male with fall, neck pain, altered mental status.  CERVICAL SPINE - 2-3 VIEW  Comparison: [facility] cervical radiographs 01/11/2003.  Findings: ACDF hardware from C3 to C5 is stable with arthrodesis suspected at these levels.  ACDF hardware from C6 to T1 is re-identified with a chronically fractured screw at the C6 level.   There may then additional hardware loosening and 2004, but the C7 and T1 screws appear stable.  There may be arthrodesis at these levels nonetheless. Prevertebral soft tissue contour is stable.  Lung apices are negative except for mild scarring.  IMPRESSION: Chronic postoperative changes from the C3 to the T1 level.  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  Appearance very similar to 01/11/2003.  Original Report Authenticated By: Harley Hallmark, M.D.   Ct Head Wo Contrast  10/02/2011  *RADIOLOGY REPORT*  Clinical Data: 71 year old male with fever nausea diarrhea weakness pain falls.  History of paraplegia.  CT HEAD WITHOUT CONTRAST  Technique:  Contiguous axial images were obtained from the base of the skull through the vertex without contrast.  Comparison: None.  Findings: Maxillary mucous retention cysts.  Scattered ethmoid mucosal thickening.  Other Visualized paranasal sinuses and mastoids are clear.  No acute osseous abnormality identified. Postoperative changes to the globes.  No acute scalp soft tissue finding.  Calcified atherosclerosis at the skull base.  Dural calcifications. No suspicious intracranial vascular hyperdensity. Cerebral volume is within normal limits for age.  No midline shift, ventriculomegaly, mass effect, evidence of mass lesion, intracranial hemorrhage or evidence of cortically based acute infarction.  Gray-white  matter differentiation is within normal limits throughout the brain.  IMPRESSION: Normal noncontrast CT appearance of the brain for age.  Mild paranasal sinus inflammation.  Original Report Authenticated By: Harley Hallmark, M.D.   Dg Chest Port 1 View  10/02/2011  *RADIOLOGY REPORT*  Clinical Data: Fever and weakness.  PORTABLE CHEST - 1 VIEW  Comparison: None.  Findings: The lungs are well-aerated.  Vascular congestion is noted, with increased interstitial markings.  This may reflect mild interstitial edema or possibly pneumonia, given clinical concern. There is no evidence of pleural effusion or pneumothorax.  The cardiomediastinal silhouette is borderline enlarged.  No acute osseous abnormalities are seen.  Cervical spinal fusion hardware is partially imaged.  IMPRESSION: Vascular congestion and borderline cardiomegaly, with increased interstitial markings; this may reflect mild interstitial edema or possibly pneumonia, given clinical concern.  Original Report Authenticated By: Tonia Ghent, M.D.    Medications: Scheduled Meds:   . sodium chloride   Intravenous STAT  . albuterol  5 mg Nebulization Once  . amiodarone  200 mg Oral Daily  . azithromycin  500 mg Intravenous Q24H  . cefTRIAXone (ROCEPHIN)  IV  1 g Intravenous Q24H  . clotrimazole   Topical BID  . dabigatran  150 mg Oral Q12H  . dutasteride  0.5 mg Oral 2 times weekly  . gabapentin  600 mg Oral TID  . insulin aspart  0-5 Units Subcutaneous QHS  . insulin aspart  0-9 Units Subcutaneous TID WC  . insulin glargine  20 Units Subcutaneous QHS  . ipratropium  0.5 mg Nebulization Once  . predniSONE  40 mg Oral QAC breakfast  . predniSONE  60 mg Oral QAC breakfast  . simvastatin  20 mg Oral q1800  . sodium chloride  3 mL Intravenous Q12H  . Tamsulosin HCl  0.4 mg Oral Daily  . DISCONTD: albuterol  2.5 mg Nebulization Q6H  . DISCONTD: dabigatran  150 mg Oral BID  . DISCONTD: furosemide  40 mg Intravenous Daily  . DISCONTD:  gabapentin  800 mg Oral BID   Continuous Infusions:  PRN Meds:.acetaminophen, acetaminophen, albuterol, albuterol, artificial tears, HYDROmorphone (DILAUDID) injection, ondansetron (ZOFRAN) IV, ondansetron, traMADol, DISCONTD: HYDROcodone-acetaminophen  Assessment/Plan: 1. Fever: probably secondary to UTI and early pneumonia. Continue IV rocephin and Zithromax. Now afebrile and feeling better. Will repeat CXR in am, follow cx's, d/c foley and follow symptoms.  2. Pulmonary vascular congestion/ Use of lasix At home: patient is not aware of any heart failure. BNP milddly elevated; but no rales, no JVD or history of CHF. Troponin negative as well. Will hold on 2-D echo; especially after improvement on his symptoms with abx's. Will continue lasix but will change to PO.  3. COPD: currently no wheezing. Good air movement. Will continue nebs and inhalers; will also continue rapid titration of steroids.  4. Hyponatremia: mild. Continue to monitor 5. Anemia : mild and normocytic. Per anemia panel fitting into anemia of chronic disease. 6. Multiple falls: no syncope as per the patient. CT head w/o acute intracranial abnormalities; per PT continue Home 24/7 support and assistance. 7. Atrial Fibrillation: rate controlled on amiodarone and pradaxa.  8. Diabetes Mellitus: continue lantus and SSI. hgba1c 7.2.  Will resume amaryl 9. Neck pain: due to injury and traumas recently; no fractures on neck x-ray; will use tramadol and neurontin for discomfort and if problem continues will advise follow up as an outpatient with Dr. Jeral Fruit for further evaluation. Patient denies numbness, tingling or any new deficit on exam. 10. Anxiety: PRN low dose ativan, especially at bedtime. 11. DVT prophylaxis: on pradaxa      LOS: 1 day   Eriyana Sweeten [facility] [phone]  10/03/2011, 12:16 PM

## 2011-10-03 NOTE — Progress Notes (Signed)
INITIAL ADULT NUTRITION ASSESSMENT Date: 10/03/2011   Time: 12:44 PM Reason for Assessment: Low Braden  ASSESSMENT: Male 71 y.o.  Dx: Fever and chills, UTI, early pna  Hx:  Past Medical History  Diagnosis Date  . Atrial fibrillation   . COPD (chronic obstructive pulmonary disease)   . Diabetes mellitus   . Hyperlipidemia   . Spinal cord injury     wheelchair bound  . Neurogenic bladder     occ infections  . Carotid stenosis   . Chronic UTI    Past Surgical History  Procedure Date  . Eye surgery     cataract extraction  . Orchiectomy     left, secondary to torsion  . X-stop implantation 1998    decompression, spinal surgery 04/2003  . Cervical spine surgery     bone graft  . Cardiac catheterization 1992  . Cervical discectomy [id number]  . Spinal cord decompression     C4  . Back surgery     Related Meds:  No current facility-administered medications on file prior to encounter.   Current Outpatient Prescriptions on File Prior to Encounter  Medication Sig Dispense Refill  . amiodarone (PACERONE) 200 MG tablet Take 1 tablet (200 mg total) by mouth daily.  30 tablet  3  . dabigatran (PRADAXA) 150 MG CAPS Take 1 capsule (150 mg total) by mouth 2 (two) times daily.  60 capsule  3  . dutasteride (AVODART) 0.5 MG capsule Take 0.5 mg by mouth 2 (two) times a week.        . furosemide (LASIX) 40 MG tablet Take 1 tablet (40 mg total) by mouth 2 (two) times daily as needed.  60 tablet  6  . gabapentin (NEURONTIN) 800 MG tablet Take 1/2 tablet by mouth four times daily  180 tablet  3  . glipiZIDE (GLUCOTROL) 10 MG tablet Take 1 tablet (10 mg total) by mouth daily.  90 tablet  2  . nitrofurantoin (MACRODANTIN) 100 MG capsule Take 100 mg by mouth at bedtime.        . pravastatin (PRAVACHOL) 20 MG tablet Take 1 tablet (20 mg total) by mouth daily.  90 tablet  3  . Tamsulosin HCl (FLOMAX) 0.4 MG CAPS Take by mouth daily.          Ht: 6' (182.9 cm)  Wt: 199 lb 1.2 oz (90.3 kg)  (bed scale)  Ideal Wt: 81 % Ideal Wt: 111  Usual Wt:  Pt thought he used to weigh 250# but hadn't been able to get on the scales for a long time.  Body mass index is 27.00 kg/(m^2).  Food/Nutrition Related Hx: Ate well prior to admit with no supplements.  Followed no concentrated sweets diet.  Drank mostly water.  Labs:  CMP     Component Value Date/Time   NA 133* 10/03/2011 0420   K 3.9 10/03/2011 0420   CL 100 10/03/2011 0420   CO2 25 10/03/2011 0420   GLUCOSE 220* 10/03/2011 0420   BUN 22 10/03/2011 0420   CREATININE 1.22 10/03/2011 0420   CREATININE 1.12 08/17/2011 1624   CALCIUM 8.7 10/03/2011 0420   PROT 6.3 10/03/2011 0420   ALBUMIN 2.6* 10/03/2011 0420   AST 24 10/03/2011 0420   ALT 22 10/03/2011 0420   ALKPHOS 50 10/03/2011 0420   BILITOT 0.2* 10/03/2011 0420   GFRNONAA 58* 10/03/2011 0420   GFRAA 67* 10/03/2011 0420    I/O last 3 completed shifts: In: 1140 [P.O.:840; I.V.:250; IV Piggyback:50] Out: 875 [  Urine:875] Total I/O In: 240 [P.O.:240] Out: -    Diet Order: CHO MOD MED  Supplements/Tube Feeding:  none  IVF:    Estimated Nutritional Needs:   Kcal: 2100-2300 Protein: 110-130 gm Fluid: >2.1 L Pt reports sacral wound.  Tech feeding pt lunch.  Pt with many high protein choices on tray.  Eating well with good appetite.  Does not want supplements or snacks at this time but expect pt can meet needs with current meal choices and intake. NUTRITION DIAGNOSIS: -Increased nutrient needs (NI-5.1).  Status: Ongoing  RELATED TO: wound  AS EVIDENCE BY: wound  MONITORING/EVALUATION(Goals): Goals:  Intake adequate for weight maintenance and wound healing. Monitor:  Weight, intake, labs, wound per chart  EDUCATION NEEDS: -No education needs identified at this time  INTERVENTION: Continue Current diet.  Provide Pt preferences.  Pt making high protein meal choices.  Dietitian [phone]  DOCUMENTATION CODES Per approved criteria  -Not Applicable    Jose Murray 10/03/2011, 12:44 PM

## 2011-10-03 NOTE — Progress Notes (Addendum)
Inpatient Diabetes Program Recommendations  AACE/ADA: New Consensus Statement on Inpatient Glycemic Control (2009)  Target Ranges:  Prepandial:   less than 140 mg/dL      Peak postprandial:   less than 180 mg/dL (1-2 hours)      Critically ill patients:  140 - 180 mg/dL   Reason for Visit: Post-prandial hyperglycemia  Inpatient Diabetes Program Recommendations Insulin - Meal Coverage: Please consider addition of meal coverage while here and on high dose prednisone.  Add 4 units Novolog tidwc  Note: Would rather use rather than Glipizide in case the patient does not eat. Thank you, Lenor Coffin, RN, CNS, Diabetes Coordinator [phone])

## 2011-10-03 NOTE — Progress Notes (Signed)
   CARE MANAGEMENT NOTE 10/03/2011  Patient:  Jose Murray, Jose Murray   Account Number:  [id number]  Date Initiated:  10/03/2011  Documentation initiated by:  Jiles Crocker  Subjective/Objective Assessment:   UTI, EARLY PNEUMONIA     Action/Plan:   PCP: Roxy Manns, MD  LIVES WITH FAMILY; AWAITING FOR PT/OT EVALS FOR DISPOSTION   Anticipated DC Date:  10/10/2011   Anticipated DC Plan:  HOME W HOME HEALTH SERVICES      DC Planning Services  CM consult      Choice offered to / List presented to:             Status of service:  In process, will continue to follow Medicare Important Message given?  NA - LOS <3 / Initial given by admissions (If response is "NO", the following Medicare IM given date fields will be blank) Per UR Regulation:  Reviewed for med. necessity/level of care/duration of stay  Comments:  10/03/2011- B Aziel Morgan RN, BSN, MHA

## 2011-10-03 NOTE — Evaluation (Signed)
Physical Therapy Evaluation Patient Details Name: MCKINNLEY SMITHEY MRN: [id number] DOB: [birth year] Today's Date: 10/03/2011 Time: 4098-1191 PT Time Calculation (min): 19 min  PT Assessment / Plan / Recommendation Clinical Impression  Pt admitted with fever and diagnosed with UTI and pneumonia.  Pt has paraplegia and is normally modified independent with mobility with motorized w/c.  Pt reports he feels weaker than usual but will have appropriate assist upon d/c.  Pt would benefit from acute PT services in order to increase independence with bed mobility and transfers to return pt to PLOF prior to d/c home.    PT Assessment  Patient needs continued PT services    Follow Up Recommendations  No PT follow up    Barriers to Discharge        lEquipment Recommendations  None recommended by PT    Recommendations for Other Services     Frequency Min 3X/week    Precautions / Restrictions Precautions Precautions: Fall Precaution Comments: paraplegic Restrictions Weight Bearing Restrictions: No   Pertinent Vitals/Pain No pain      Mobility  Bed Mobility Bed Mobility: Supine to Sit;Sit to Supine Supine to Sit: 2: Max assist Sit to Supine: 1: +2 Total assist Sit to Supine: Patient Percentage: 10% Details for Bed Mobility Assistance: pt requiring increased assist 2* feeling weak since admission, also reports used to pulling up on bars around bed in home environment Transfers Transfers: Not assessed Details for Transfer Assistance: pt declined transfer today 2* feeling too weak but agreeble upon next visit Ambulation/Gait Ambulation/Gait Assistance: Other (comment) (pt w/c bound)    Exercises     PT Diagnosis: Generalized weakness  PT Problem List: Decreased strength;Decreased activity tolerance;Decreased mobility PT Treatment Interventions: DME instruction;Functional mobility training;Therapeutic activities;Therapeutic exercise;Patient/family education   PT Goals Acute Rehab PT  Goals PT Goal Formulation: With patient Time For Goal Achievement: 10/17/11 Potential to Achieve Goals: Good Pt will go Supine/Side to Sit: with min assist PT Goal: Supine/Side to Sit - Progress: Goal set today Pt will go Sit to Supine/Side: with min assist PT Goal: Sit to Supine/Side - Progress: Goal set today Pt will Transfer Bed to Chair/Chair to Bed: with min assist PT Transfer Goal: Bed to Chair/Chair to Bed - Progress: Goal set today  Visit Information  Last PT Received On: 10/03/11 Assistance Needed: +2    Subjective Data  Subjective: "I'm so glad you'll came."   Prior Functioning  Home Living Lives With: Son Available Help at Discharge: Personal care attendant;Available 24 hours/day Type of Home: House Home Layout: One level Bathroom Shower/Tub: Naval architect Equipment: Tub transfer bench;Wheelchair - powered;Grab bars around toilet;Grab bars in shower Prior Function Level of Independence: Independent with assistive device(s) Comments: Pt reports he is mod I with transfer from bed to w/c and has many pull up bars around bed for transfers and grab bars in bathroom. Communication Communication: No difficulties    Cognition  Overall Cognitive Status: Appears within functional limits for tasks assessed/performed Arousal/Alertness: Awake/alert Orientation Level: Appears intact for tasks assessed Behavior During Session: [facility] for tasks performed    Extremity/Trunk Assessment Right Upper Extremity Assessment RUE ROM/Strength/Tone: Deficits RUE ROM/Strength/Tone Deficits: hx of upper cervical SCI Left Upper Extremity Assessment LUE ROM/Strength/Tone: Deficits LUE ROM/Strength/Tone Deficits: hx of upper cervical SCI Right Lower Extremity Assessment RLE ROM/Strength/Tone: Deficits RLE ROM/Strength/Tone Deficits: hx of upper cervical SCI Left Lower Extremity Assessment LLE ROM/Strength/Tone: Deficits LLE ROM/Strength/Tone Deficits: hx of upper cervical  SCI Trunk Assessment Trunk Assessment: Other exceptions  Trunk Exceptions: hx of upper cervical SCI, able to prop into forward trunk flexion for sitting without support, no trunk strength however   Balance Balance Balance Assessed: Yes Static Sitting Balance Static Sitting - Balance Support: Right upper extremity supported;Left upper extremity supported;Feet supported Static Sitting - Level of Assistance: 5: Stand by assistance Static Sitting - Comment/# of Minutes: once pt brought to sitting, he is able to prop himself into position to sit EOB 5 min with supervision  End of Session PT - End of Session Activity Tolerance: Patient tolerated treatment well Patient left: in bed;with call bell/phone within reach;with bed alarm set;with family/visitor present   Zenith Kercheval,KATHrine E 10/03/2011, 10:50 AM Pager: [phone]

## 2011-10-04 DIAGNOSIS — N390 Urinary tract infection, site not specified: Secondary | ICD-10-CM

## 2011-10-04 DIAGNOSIS — J159 Unspecified bacterial pneumonia: Secondary | ICD-10-CM

## 2011-10-04 DIAGNOSIS — R7881 Bacteremia: Secondary | ICD-10-CM

## 2011-10-04 DIAGNOSIS — M542 Cervicalgia: Secondary | ICD-10-CM

## 2011-10-04 LAB — GLUCOSE, CAPILLARY: Glucose-Capillary: 263 mg/dL — ABNORMAL HIGH (ref 70–99)

## 2011-10-04 LAB — URINE CULTURE
Colony Count: NO GROWTH
Culture: NO GROWTH

## 2011-10-04 LAB — CBC
Platelets: 196 10*3/uL (ref 150–400)
RBC: 3.43 MIL/uL — ABNORMAL LOW (ref 4.22–5.81)
RDW: 13.8 % (ref 11.5–15.5)
WBC: 7.5 10*3/uL (ref 4.0–10.5)

## 2011-10-04 LAB — BASIC METABOLIC PANEL
CO2: 27 mEq/L (ref 19–32)
Calcium: 8.7 mg/dL (ref 8.4–10.5)
Chloride: 99 mEq/L (ref 96–112)
GFR calc Af Amer: 76 mL/min — ABNORMAL LOW (ref >90)
Sodium: 135 mEq/L (ref 135–145)

## 2011-10-04 MED ORDER — GLIPIZIDE 10 MG PO TABS
10.0000 mg | ORAL_TABLET | Freq: Every day | ORAL | Status: DC
  Administered 2011-10-05: 10 mg via ORAL
  Filled 2011-10-04 (×4): qty 1

## 2011-10-04 NOTE — Progress Notes (Signed)
MD notified that pt is refusing to eat lunch, order to hold this one dose of Glipizide since pt is not eating, MD wants pt to still receive 2 units Novolog per SSI orders.

## 2011-10-04 NOTE — Progress Notes (Addendum)
Subjective: Afebrile; feeling better. Reports improvement on his neck pain and endorses feeling stronger. Denies CP, abdominal pain , nausea, vomiting or any other complaints.  Objective: Vital signs in last 24 hours: Temp:  [97.5 F (36.4 C)-98.4 F (36.9 C)] 97.5 F (36.4 C) (06/20 0521) Pulse Rate:  [70-81] 70  (06/20 0521) Resp:  [18-20] 18  (06/20 0521) BP: (109-128)/(59-70) 109/68 mmHg (06/20 0521) SpO2:  [95 %-97 %] 97 % (06/20 0521) Weight change:  Last BM Date: 10/02/11  Intake/Output from previous day: 06/19 0701 - 06/20 0700 In: 1130 [P.O.:1080; IV Piggyback:50] Out: 3750 [Urine:3750]     Physical Exam: General: Alert, awake, oriented x3, in no acute distress. HEENT: No bruits, no goiter. Heart: s1 and S2; no rubs or gallops Lungs: scattered rhonchi; otherwise clear Abdomen: Soft, nontender, nondistended, positive bowel sounds. Extremities: No clubbing or cyanosis; trace to 1+edema bilaterally. Neuro: No new deficit.    Lab Results: Basic Metabolic Panel:  Basename 10/04/11 0409 10/03/11 0420  NA 135 133*  K 3.7 3.9  CL 99 100  CO2 27 25  GLUCOSE 156* 220*  BUN 28* 22  CREATININE 1.10 1.22  CALCIUM 8.7 8.7  MG -- --  PHOS -- --   Liver Function Tests:  Basename 10/03/11 0420 10/02/11 0245  AST 24 22  ALT 22 17  ALKPHOS 50 61  BILITOT 0.2* 0.4  PROT 6.3 6.9  ALBUMIN 2.6* 3.3*   CBC:  Basename 10/04/11 0409 10/03/11 0420 10/02/11 0245  WBC 7.5 6.5 --  NEUTROABS -- -- 6.3  HGB 10.5* 10.8* --  HCT 32.9* 33.6* --  MCV 95.9 96.3 --  PLT 196 172 --   BNP:  Basename 10/02/11 0245  PROBNP 2045.0*   CBG:  Basename 10/04/11 0726 10/03/11 2024 10/03/11 1746 10/03/11 1221 10/03/11 1016 10/03/11 0651  GLUCAP 147* 363* 334* 285* 199* 158*   Hemoglobin A1C:  Basename 10/02/11 0836  HGBA1C 7.2*   Thyroid Function Tests:  Basename 10/02/11 1010  TSH 0.490  T4TOTAL --  FREET4 --  T3FREE --  THYROIDAB --   Anemia Panel:  Basename  10/02/11 1010  VITAMINB12 350  FOLATE 9.8  FERRITIN 105  TIBC 290  IRON 11*  RETICCTPCT 1.3   Coagulation:  Basename 10/03/11 0420  LABPROT 18.1*  INR 1.47   Urinalysis:  Basename 10/02/11 0158  COLORURINE YELLOW  LABSPEC 1.021  PHURINE 5.5  GLUCOSEU 100*  HGBUR LARGE*  BILIRUBINUR NEGATIVE  KETONESUR TRACE*  PROTEINUR 100*  UROBILINOGEN 1.0  NITRITE POSITIVE*  LEUKOCYTESUR MODERATE*   Misc. Labs:  Recent Results (from the past 240 hour(s))  URINE CULTURE     Status: Normal (Preliminary result)   Collection Time   10/02/11  1:58 AM      Component Value Range Status Comment   Specimen Description URINE, CATHETERIZED   Final    Special Requests NONE   Final    Culture  Setup Time [id number]   Final    Colony Count >=100,000 COLONIES/ML   Final    Culture GRAM NEGATIVE RODS   Final    Report Status PENDING   Incomplete   CULTURE, BLOOD (ROUTINE X 2)     Status: Normal (Preliminary result)   Collection Time   10/02/11  2:45 AM      Component Value Range Status Comment   Specimen Description BLOOD LEFT WRIST   Final    Special Requests BOTTLES DRAWN AEROBIC AND ANAEROBIC 5CC   Final    Culture  Setup Time [id number]   Final    Culture     Final    Value:        BLOOD CULTURE RECEIVED NO GROWTH TO DATE CULTURE WILL BE HELD FOR 5 DAYS BEFORE ISSUING A FINAL NEGATIVE REPORT   Report Status PENDING   Incomplete   CULTURE, BLOOD (ROUTINE X 2)     Status: Normal (Preliminary result)   Collection Time   10/02/11  3:40 AM      Component Value Range Status Comment   Specimen Description BLOOD LEFT FOREARM   Final    Special Requests BOTTLES DRAWN AEROBIC AND ANAEROBIC 5CC   Final    Culture  Setup Time [id number]   Final    Culture     Final    Value: GRAM NEGATIVE RODS     Note: Gram Stain Report Called to,Read Back By and Verified With: [facility] 10/03/11 1515 BY SMITHERSJ   Report Status PENDING   Incomplete   URINE CULTURE     Status: Normal   Collection Time     10/02/11  4:36 PM      Component Value Range Status Comment   Specimen Description URINE, CLEAN CATCH   Final    Special Requests NONE   Final    Culture  Setup Time [id number]   Final    Colony Count NO GROWTH   Final    Culture NO GROWTH   Final    Report Status 10/04/2011 FINAL   Final     Studies/Results: Dg Chest 1 View  10/03/2011  *RADIOLOGY REPORT*  Clinical Data: 71 year old male with shortness of breath and cough. Possible pneumonia.  CHEST - 1 VIEW  Comparison: 10/02/2011.  Findings: Portable upright AP view 1310 hours. Stable cardiomegaly and mediastinal contours.  Stable increased interstitial markings. No pneumothorax.  No definite effusion.  No consolidation or confluent pulmonary opacity.  Cervical ACDF hardware.  IMPRESSION: Stable increased interstitial markings, favor vascular congestion but viral / atypical respiratory infection is possible.  No focal pneumonia.  Original Report Authenticated By: Harley Hallmark, M.D.   Dg Cervical Spine 2-3 Views  10/02/2011  *RADIOLOGY REPORT*  Clinical Data: 71 year old male with fall, neck pain, altered mental status.  CERVICAL SPINE - 2-3 VIEW  Comparison: [facility] cervical radiographs 01/11/2003.  Findings: ACDF hardware from C3 to C5 is stable with arthrodesis suspected at these levels.  ACDF hardware from C6 to T1 is re-identified with a chronically fractured screw at the C6 level.   There may then additional hardware loosening and 2004, but the C7 and T1 screws appear stable.  There may be arthrodesis at these levels nonetheless. Prevertebral soft tissue contour is stable.  Lung apices are negative except for mild scarring.  IMPRESSION: Chronic postoperative changes from the C3 to the T1 level.  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  Appearance very similar to 01/11/2003.  Original Report Authenticated By: Harley Hallmark, M.D.   Ct Head Wo Contrast  10/02/2011   *RADIOLOGY REPORT*  Clinical Data: 71 year old male with fever nausea diarrhea weakness pain falls.  History of paraplegia.  CT HEAD WITHOUT CONTRAST  Technique:  Contiguous axial images were obtained from the base of the skull through the vertex without contrast.  Comparison: None.  Findings: Maxillary mucous retention cysts.  Scattered ethmoid mucosal thickening.  Other Visualized paranasal sinuses and mastoids are clear.  No acute osseous abnormality identified. Postoperative changes to the globes.  No acute scalp soft tissue finding.  Calcified atherosclerosis at the skull base.  Dural calcifications. No suspicious intracranial vascular hyperdensity. Cerebral volume is within normal limits for age.  No midline shift, ventriculomegaly, mass effect, evidence of mass lesion, intracranial hemorrhage or evidence of cortically based acute infarction.  Gray-white matter differentiation is within normal limits throughout the brain.  IMPRESSION: Normal noncontrast CT appearance of the brain for age.  Mild paranasal sinus inflammation.  Original Report Authenticated By: Harley Hallmark, M.D.    Medications: Scheduled Meds:    . albuterol  5 mg Nebulization Once  . amiodarone  200 mg Oral Daily  . azithromycin  500 mg Intravenous Q24H  . cefTRIAXone (ROCEPHIN)  IV  1 g Intravenous Q24H  . clotrimazole   Topical BID  . dabigatran  150 mg Oral Q12H  . dutasteride  0.5 mg Oral 2 times weekly  . gabapentin  600 mg Oral TID  . insulin aspart  0-5 Units Subcutaneous QHS  . insulin aspart  0-9 Units Subcutaneous TID WC  . insulin glargine  20 Units Subcutaneous QHS  . ipratropium  0.5 mg Nebulization Once  . predniSONE  40 mg Oral QAC breakfast  . predniSONE  60 mg Oral QAC breakfast  . simvastatin  20 mg Oral q1800  . sodium chloride  3 mL Intravenous Q12H  . Tamsulosin HCl  0.4 mg Oral Daily  . DISCONTD: furosemide  40 mg Intravenous Daily  . DISCONTD: gabapentin  800 mg Oral BID   Continuous  Infusions:  PRN Meds:.acetaminophen, acetaminophen, albuterol, artificial tears, HYDROmorphone (DILAUDID) injection, LORazepam, ondansetron (ZOFRAN) IV, ondansetron, traMADol, DISCONTD: HYDROcodone-acetaminophen  Assessment/Plan: 1. Fever: secondary to gram negative rods UTI and bacteremia; also early pneumonia. Continue IV rocephin and Zithromax. Now afebrile and feeling better. CXR stable, follow cx's, d/c foley and follow symptoms.  2. Pulmonary vascular congestion/ Use of lasix At home: patient is not aware of any heart failure. BNP milddly elevated; but no rales, no JVD or history of CHF. Troponin negative as well. Will continue lasix PO.  3. COPD: currently no wheezing. Good air movement. Will continue nebs and inhalers; will also continue rapid titration of steroids, will be over tomorrow in am. 4. Hyponatremia: stable; mild. Continue to monitor 5. Anemia : mild and normocytic. Per anemia panel fitting into anemia of chronic disease. 6. Multiple falls: no syncope as per the patient. CT head w/o acute intracranial abnormalities; per PT continue Home 24/7 support and assistance. Will place orders for HHPT, HHRN and also social work. Concerns for safe environment at home and caregiver situation. 7. Atrial Fibrillation: rate controlled on amiodarone and pradaxa.  8. Diabetes Mellitus: continue lantus and SSI. hgba1c 7.2.  Will resume glucotrol. 9. Neck pain: due to injury and traumas recently; no fractures on neck x-ray; will use tramadol and neurontin for discomfort and if problem continues patient has been advised to follow up as an outpatient with Dr. Jeral Fruit for further evaluation. Patient denies numbness, tingling or any new deficit on exam. 10. Anxiety: PRN low dose ativan, especially at bedtime. 11. Gram negative Rods bacteremia and UTI: continue IV antibiotics; sensitivity and speciation still pending. 12. DVT prophylaxis: on pradaxa      LOS: 2 days   Anupama Piehl [facility] [phone]  10/04/2011, 9:16 AM

## 2011-10-04 NOTE — Progress Notes (Signed)
Talked to patient about HHC, patient refused HHC, stated " I do not need that, I will be all right". CM will continue to follow for DCP. Abelino Derrick RN, BSN, [employer]

## 2011-10-04 NOTE — Progress Notes (Deleted)
Clinical Social Work Department CLINICAL SOCIAL WORK PLACEMENT NOTE 10/04/2011  Patient:  Ellard Artis  Account Number:  [id number] Admit date:  10/01/2011  Clinical Social Worker:  Cori Razor, LCSW  Date/time:  10/03/2011 07:18 AM  Clinical Social Work is seeking post-discharge placement for this patient at the following level of care:   SKILLED NURSING   (*CSW will update this form in Epic as items are completed)   10/02/2011  Patient/family provided with [facility] Department of Clinical Social Work's list of facilities offering this level of care within the geographic area requested by the patient (or if unable, by the patient's family).  10/02/2011  Patient/family informed of their freedom to choose among providers that offer the needed level of care, that participate in Medicare, Medicaid or managed care program needed by the patient, have an available bed and are willing to accept the patient.  10/02/2011  Patient/family informed of [facility]' ownership interest in [facility], as well as of the fact that they are under no obligation to receive care at this facility.  PASARR submitted to EDS on  PASARR number received from EDS on   FL2 transmitted to all facilities in geographic area requested by pt/family on  10/03/2011 FL2 transmitted to all facilities within larger geographic area on   Patient informed that his/her managed care company has contracts with or will negotiate with  certain facilities, including the following:     Patient/family informed of bed offers received:  10/03/2011 Patient chooses bed at [facility] Physician recommends and patient chooses bed at    Patient to be transferred to [facility] on  10/04/2011 Patient to be transferred to facility by [facility]  The following physician request were entered in Epic:   Additional Comments:  Zandra Abts LCSW [phone]

## 2011-10-04 NOTE — Progress Notes (Signed)
Clinical Social Work Department BRIEF PSYCHOSOCIAL ASSESSMENT 10/04/2011  Patient:  Jose Murray, Jose Murray     Account Number:  [id number]     Admit date:  10/02/2011  Clinical Social Worker:  Candie Chroman  Date/Time:  10/04/2011 01:42 PM  Referred by:  Physician  Date Referred:  10/04/2011 Referred for  Psychosocial assessment   Other Referral:   Interview type:  Patient Other interview type:    PSYCHOSOCIAL DATA Living Status:  FAMILY Admitted from facility:   Level of care:   Primary support name:  Merril Nagy Primary support relationship to patient: Son  Degree of support available:   supportive    CURRENT CONCERNS Current Concerns  Other - See comment   Other Concerns:   MD questioning home support    SOCIAL WORK ASSESSMENT / PLAN Pt is a 17 yr. old gentleman living at home with his son and son's girlfriend. Pt indicates that he requires assistance with ADL's at home. Son's girlfriend provides needed care. Pt states that GF takes good care of him. Pt states that the arrangement is working out well for him and he plans to return home upon d/c. Pt also states that GF apologized for not agreeing to leave yesterday when pt requested her departure. Pt states that GF is " very good to him at home " and if she wasn't he would have GF move out of his residence. CSW provided pt with cell # and encouraged him to call CSW with concerns.   Assessment/plan status:  No Further Intervention Required Other assessment/ plan:   Home with family support.   Information/referral to community resources:   Pt declined at this time.    PATIENT'S/FAMILY'S RESPONSE TO PLAN OF CARE: Pt plans to return home. Pt declines need for [facility] or [facility].   Cori Razor LCSW [phone]

## 2011-10-05 DIAGNOSIS — R7881 Bacteremia: Secondary | ICD-10-CM

## 2011-10-05 DIAGNOSIS — M542 Cervicalgia: Secondary | ICD-10-CM

## 2011-10-05 DIAGNOSIS — N390 Urinary tract infection, site not specified: Secondary | ICD-10-CM

## 2011-10-05 DIAGNOSIS — J159 Unspecified bacterial pneumonia: Secondary | ICD-10-CM

## 2011-10-05 LAB — URINE CULTURE: Culture  Setup Time: 201306181419

## 2011-10-05 LAB — GLUCOSE, CAPILLARY: Glucose-Capillary: 148 mg/dL — ABNORMAL HIGH (ref 70–99)

## 2011-10-05 MED ORDER — LEVOFLOXACIN 750 MG PO TABS: 750.0000 mg | ORAL_TABLET | Freq: Every day | ORAL | Status: AC

## 2011-10-05 MED ORDER — GABAPENTIN 300 MG PO CAPS
600.0000 mg | ORAL_CAPSULE | Freq: Three times a day (TID) | ORAL | Status: DC
  Administered 2011-10-05: 600 mg via ORAL
  Filled 2011-10-05 (×2): qty 2

## 2011-10-05 MED ORDER — CLOTRIMAZOLE 1 % EX CREA: TOPICAL_CREAM | Freq: Two times a day (BID) | CUTANEOUS | Status: DC

## 2011-10-05 MED ORDER — TRAMADOL HCL 50 MG PO TABS: 50.0000 mg | ORAL_TABLET | Freq: Four times a day (QID) | ORAL | Status: AC | PRN

## 2011-10-05 MED ORDER — GABAPENTIN 600 MG PO TABS: 600.0000 mg | ORAL_TABLET | Freq: Three times a day (TID) | ORAL | Status: DC

## 2011-10-05 NOTE — Progress Notes (Signed)
Physical Therapy Treatment Patient Details Name: Jose Murray MRN: [id number] DOB: 04/23/1940 Today's Date: 10/05/2011 Time: 1346-1410 PT Time Calculation (min): 24 min  PT Assessment / Plan / Recommendation Comments on Treatment Session  Pt able to assist with transfer over to high back w/c.  Pt reports son will be arriving soon to assist pt home.    Follow Up Recommendations  No PT follow up    Barriers to Discharge        Equipment Recommendations  None recommended by PT    Recommendations for Other Services    Frequency     Plan Discharge plan remains appropriate    Precautions / Restrictions Precautions Precautions: Fall Precaution Comments: paraplegic   Pertinent Vitals/Pain No pain    Mobility  Bed Mobility Bed Mobility: Supine to Sit Supine to Sit: 2: Max assist;HOB elevated Details for Bed Mobility Assistance: assist pt with feet off bed and little for trunk, pt instructed PT on where he needed assist to pull up Transfers Transfers: Lateral/Scoot Transfers Lateral/Scoot Transfers: 1: +2 Total assist Lateral Transfers: Patient Percentage: 50% Details for Transfer Assistance: pt knees blocked, pt assisted with forward lean and scoot of hips x3 over to w/c with high back, repositioned pt's feet with each scoot, pt pleased with transfer    Exercises     PT Diagnosis:    PT Problem List:   PT Treatment Interventions:     PT Goals Acute Rehab PT Goals PT Goal: Supine/Side to Sit - Progress: Progressing toward goal PT Transfer Goal: Bed to Chair/Chair to Bed - Progress: Progressing toward goal  Visit Information  Last PT Received On: 10/05/11 Assistance Needed: +2    Subjective Data  Subjective: "Everyone here has been so nice."   Cognition  Overall Cognitive Status: Appears within functional limits for tasks assessed/performed    Balance     End of Session PT - End of Session Activity Tolerance: Patient tolerated treatment well Patient left: in  chair;Other (comment) (in w/c, nsg tech aware)    Jose Murray,KATHrine E 10/05/2011, 3:26 PM Pager: [phone]

## 2011-10-05 NOTE — Discharge Summary (Signed)
Physician Discharge Summary  Patient ID: Jose Murray MRN: [id number] DOB/AGE: [birth year] 71 y.o.  Admit date: 10/02/2011 Discharge date: 10/05/2011  Primary Care Physician:  Roxy Manns, MD   Discharge Diagnoses:   .Fever and chills (2/2 to Klebsiella pneumonia UTI/bacteremia) .K. Pneumoniae UTI (lower urinary tract infection) .DIABETES MELLITUS, TYPE II .Atrial fibrillation .Fall .Anemia .Gram negative Rods bacteremia .COPD  Medication List  As of 10/05/2011  2:22 PM   STOP taking these medications         nitrofurantoin 100 MG capsule         TAKE these medications         amiodarone 200 MG tablet   Commonly known as: PACERONE   Take 1 tablet (200 mg total) by mouth daily.      clotrimazole 1 % cream   Commonly known as: LOTRIMIN   Apply topically 2 (two) times daily. Apply to groin area twice a day      dabigatran 150 MG Caps   Commonly known as: PRADAXA   Take 1 capsule (150 mg total) by mouth 2 (two) times daily.      dutasteride 0.5 MG capsule   Commonly known as: AVODART   Take 0.5 mg by mouth 2 (two) times a week.      FLOMAX 0.4 MG Caps   Generic drug: Tamsulosin HCl   Take by mouth daily.      furosemide 40 MG tablet   Commonly known as: LASIX   Take 1 tablet (40 mg total) by mouth 2 (two) times daily as needed.      gabapentin 600 MG tablet   Commonly known as: NEURONTIN   Take 1 tablet (600 mg total) by mouth 3 (three) times daily.      glipiZIDE 10 MG tablet   Commonly known as: GLUCOTROL   Take 1 tablet (10 mg total) by mouth daily.      insulin glargine 100 UNIT/ML injection   Commonly known as: LANTUS   Inject 20-30 Units into the skin at bedtime.      levofloxacin 750 MG tablet   Commonly known as: LEVAQUIN   Take 1 tablet (750 mg total) by mouth daily.      metFORMIN 500 MG tablet   Commonly known as: GLUCOPHAGE   Take 750 mg by mouth 2 (two) times daily with a meal.      pravastatin 20 MG tablet   Commonly known as:  PRAVACHOL   Take 1 tablet (20 mg total) by mouth daily.      traMADol 50 MG tablet   Commonly known as: ULTRAM   Take 1 tablet (50 mg total) by mouth every 6 (six) hours as needed.             Disposition and Follow-up:  Discharge in stable and improved condition; he will finish antibiotics by mouth as prescribed and will arrange follow up with PCP in 2 weeks to overlook chronic conditions and to guarantee that symptoms has completely resolved.  Consults:  ID (curbside; Dr. Maurice March)   Significant Diagnostic Studies:  Dg Cervical Spine 2-3 Views  10/02/2011  *RADIOLOGY REPORT*  Clinical Data: 71 year old male with fall, neck pain, altered mental status.  CERVICAL SPINE - 2-3 VIEW  Comparison: [facility] cervical radiographs 01/11/2003.  Findings: ACDF hardware from C3 to C5 is stable with arthrodesis suspected at these levels.  ACDF hardware from C6 to T1 is re-identified with a chronically fractured screw at the C6  level.   There may then additional hardware loosening and 2004, but the C7 and T1 screws appear stable.  There may be arthrodesis at these levels nonetheless. Prevertebral soft tissue contour is stable.  Lung apices are negative except for mild scarring.  IMPRESSION: Chronic postoperative changes from the C3 to the T1 level.  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  Appearance very similar to 01/11/2003.  Original Report Authenticated By: Harley Hallmark, M.D.   Ct Head Wo Contrast  10/02/2011  *RADIOLOGY REPORT*  Clinical Data: 71 year old male with fever nausea diarrhea weakness pain falls.  History of paraplegia.  CT HEAD WITHOUT CONTRAST  Technique:  Contiguous axial images were obtained from the base of the skull through the vertex without contrast.  Comparison: None.  Findings: Maxillary mucous retention cysts.  Scattered ethmoid mucosal thickening.  Other Visualized paranasal sinuses and mastoids are clear.  No  acute osseous abnormality identified. Postoperative changes to the globes.  No acute scalp soft tissue finding.  Calcified atherosclerosis at the skull base.  Dural calcifications. No suspicious intracranial vascular hyperdensity. Cerebral volume is within normal limits for age.  No midline shift, ventriculomegaly, mass effect, evidence of mass lesion, intracranial hemorrhage or evidence of cortically based acute infarction.  Gray-white matter differentiation is within normal limits throughout the brain.  IMPRESSION: Normal noncontrast CT appearance of the brain for age.  Mild paranasal sinus inflammation.  Original Report Authenticated By: Harley Hallmark, M.D.   Dg Chest Port 1 View  10/02/2011  *RADIOLOGY REPORT*  Clinical Data: Fever and weakness.  PORTABLE CHEST - 1 VIEW  Comparison: None.  Findings: The lungs are well-aerated.  Vascular congestion is noted, with increased interstitial markings.  This may reflect mild interstitial edema or possibly pneumonia, given clinical concern. There is no evidence of pleural effusion or pneumothorax.  The cardiomediastinal silhouette is borderline enlarged.  No acute osseous abnormalities are seen.  Cervical spinal fusion hardware is partially imaged.  IMPRESSION: Vascular congestion and borderline cardiomegaly, with increased interstitial markings; this may reflect mild interstitial edema or possibly pneumonia, given clinical concern.  Original Report Authenticated By: Tonia Ghent, M.D.    Brief H and P: For complete details please refer to admission H and P, but in brief 71 year old male with h/o paraplegia lives at home with his son, was brought to the hospital for fever, chills since yesterday. On arrival to ED he was found t have a temp of 103 F, UTI and early Pneumonia. He and his friend at bedside also reports multiple falls at home over the last few weeks, with generalized weakness. He denies chest pain or palpitations or syncope.   Hospital Course:    1-Fever: secondary to Klebsiella Pneumoniae UTI and bacteremia; also early pneumonia: Id curbside and following speciation and sensitivity plan is to complete 14 days using levaquin PO. Patient will follow instructions and take levaquin for another 11 days. Follow up with PCP in 2 weeks to make sure symptoms are completely resolved.  2-Pulmonary vascular congestion/ Use of lasix At home: patient is not aware of any heart failure. BNP milddly elevated; but no rales, no JVD or history of CHF. Troponin negative as well. Will continue lasix PO; home dose.   3-COPD: currently no wheezing. Good air movement.received short rapid steroid tapering inside the hospital. Follow up with PCP to review and determine maintenance regimen if needed.  4-Hyponatremia: resolved.  5-Anemia : mild and normocytic. Per anemia panel fitting into anemia  of chronic disease. No transfusion required.  6-Multiple falls: no syncope as per the patient. CT head w/o acute intracranial abnormalities; per PT continue Home 24/7 support and assistance. Will place orders for HHPT, HHRN and also social work. Concerns for safe environment at home and caregiver situation.   7-Atrial Fibrillation: rate controlled on amiodarone and pradaxa.  8-Diabetes Mellitus: continue lantus and SSI. hgba1c 7.2. Will resume home regimen. Follow up with PCP for further adjustments.  9-Neck pain: due to injury and traumas recently; no fractures on neck x-ray; will use tramadol and neurontin for discomfort and if problem continues patient has been advised to follow up as an outpatient with Dr. Jeral Fruit for further evaluation. Patient denies numbness, tingling or any new deficit on exam.   10-Gram negative Rods bacteremia and UTI: As mentioned above; will treat with levaquin 750mg  for another 11 days to complete treatment. Patient no longer febrile and with WBC's WNL.    Time spent on Discharge: 40 minutes  Signed: Lavada Langsam 10/05/2011, 2:22  PM

## 2011-10-05 NOTE — Progress Notes (Signed)
Patient discharge to home, son at bedside.D/c instuctions and follow up appointment done and given to the son, verbalized understanding.PIV  Removed no s/s of infiltration or swelling noted.

## 2011-10-11 LAB — CULTURE, BLOOD (ROUTINE X 2): Culture  Setup Time: 201306180835

## 2011-10-15 ENCOUNTER — Ambulatory Visit: Payer: Medicare Other | Admitting: Family Medicine

## 2011-10-26 ENCOUNTER — Encounter: Payer: Self-pay | Admitting: Family Medicine

## 2011-10-26 ENCOUNTER — Ambulatory Visit (INDEPENDENT_AMBULATORY_CARE_PROVIDER_SITE_OTHER): Payer: Medicare Other | Admitting: Family Medicine

## 2011-10-26 VITALS — BP 126/67 | HR 69 | Temp 98.4°F | Ht 72.0 in | Wt 202.0 lb

## 2011-10-26 DIAGNOSIS — M25561 Pain in right knee: Secondary | ICD-10-CM

## 2011-10-26 DIAGNOSIS — J150 Pneumonia due to Klebsiella pneumoniae: Secondary | ICD-10-CM

## 2011-10-26 DIAGNOSIS — D638 Anemia in other chronic diseases classified elsewhere: Secondary | ICD-10-CM

## 2011-10-26 DIAGNOSIS — D649 Anemia, unspecified: Secondary | ICD-10-CM

## 2011-10-26 DIAGNOSIS — M25569 Pain in unspecified knee: Secondary | ICD-10-CM

## 2011-10-26 DIAGNOSIS — N390 Urinary tract infection, site not specified: Secondary | ICD-10-CM

## 2011-10-26 LAB — CBC WITH DIFFERENTIAL/PLATELET
Basophils Absolute: 0 10*3/uL (ref 0.0–0.1)
Basophils Relative: 0.3 % (ref 0.0–3.0)
Eosinophils Absolute: 0.2 10*3/uL (ref 0.0–0.7)
HCT: 36 % — ABNORMAL LOW (ref 39.0–52.0)
Hemoglobin: 11.8 g/dL — ABNORMAL LOW (ref 13.0–17.0)
Lymphocytes Relative: 10.4 % — ABNORMAL LOW (ref 12.0–46.0)
Lymphs Abs: 1 10*3/uL (ref 0.7–4.0)
MCHC: 32.7 g/dL (ref 30.0–36.0)
MCV: 97.2 fl (ref 78.0–100.0)
Monocytes Absolute: 0.6 10*3/uL (ref 0.1–1.0)
Neutro Abs: 7.5 10*3/uL (ref 1.4–7.7)
RBC: 3.7 Mil/uL — ABNORMAL LOW (ref 4.22–5.81)
RDW: 15 % — ABNORMAL HIGH (ref 11.5–14.6)

## 2011-10-26 LAB — POCT URINALYSIS DIPSTICK
Bilirubin, UA: NEGATIVE
Glucose, UA: 250
Nitrite, UA: NEGATIVE
Urobilinogen, UA: 0.2

## 2011-10-26 LAB — POCT UA - MICROSCOPIC ONLY

## 2011-10-26 LAB — IBC PANEL: Transferrin: 248.6 mg/dL (ref 212.0–360.0)

## 2011-10-26 NOTE — Patient Instructions (Addendum)
Continue urology follow up  Lungs sound good  Re check blood for anemia today  Make appt with Dr Patsy Lager at check out for knee pain

## 2011-10-26 NOTE — Assessment & Plan Note (Signed)
Clinically resolved Rev hosp records in detail Cbc today Reassuring exam

## 2011-10-26 NOTE — Assessment & Plan Note (Signed)
Lab Results  Component Value Date   WBC 7.5 10/04/2011   HGB 10.5* 10/04/2011   HCT 32.9* 10/04/2011   MCV 95.9 10/04/2011   PLT 196 10/04/2011    Suspect due to chronic dz Re check with iron studies today and update  Is on pradaxa

## 2011-10-26 NOTE — Progress Notes (Signed)
Subjective:    Patient ID: Jose Murray, male    DOB: 01/05/1941, 71 y.o.   MRN: [id number]  HPI Here for f/u of hosp for bacteremia / klebsiella (with evidence of uti and also pneumonia )  Wt is up 3 lb, bp stable   Also had anemia 10.1 hb thought to be from chronic dz-no indication of blood loss Need to re check cbc   Was discharged on levaquin - finished that  Is feeling much better overall  Was discharged on 6/21  Running urine today -no symptoms at all right now  Prostate swelled some - is back on avodart  Had to self cath once   Back on nitrofurantoin from urology  His urologist thought that the pneumonia was more likely the cause of the bacteremia  No urinary symptoms now   No sob or wheeze Coughs occ-nl for him   In addition to that he has a lot of problems with his R knee - it swells and he has hx of OA in it  Wanted px for vicodin Has never seen ortho or had injections  Interested in eval He is wheelchair bound but does do rom exercises/ and says this pain keeps him up at night   Patient Active Problem List  Diagnosis  . DIABETES MELLITUS, TYPE II  . HYPERLIPIDEMIA  . RBB W/ LFAB  . Atrial fibrillation  . ATRIAL FLUTTER  . SYSTOLIC HEART FAILURE, CHRONIC  . CAROTID OCCLUSIVE DISEASE  . COPD  . URINARY TRACT INFECTION, CHRONIC  . URINARY RETENTION  . MICROALBUMINURIA  . SPINAL CORD INJURY  . TOBACCO USE, QUIT  . Hyperkalemia  . Chronic shoulder pain  . Blurry vision, right eye  . Pedal edema  . Fall  . Fever and chills  . Anemia  . Pneumonia  . UTI (lower urinary tract infection)  . Neck pain  . Pneumonia due to Klebsiella pneumoniae  . Right knee pain   Past Medical History  Diagnosis Date  . Atrial fibrillation   . COPD (chronic obstructive pulmonary disease)   . Diabetes mellitus   . Hyperlipidemia   . Spinal cord injury     wheelchair bound  . Neurogenic bladder     occ infections  . Carotid stenosis   . Chronic UTI    Past  Surgical History  Procedure Date  . Eye surgery     cataract extraction  . Orchiectomy     left, secondary to torsion  . X-stop implantation 1998    decompression, spinal surgery 04/2003  . Cervical spine surgery     bone graft  . Cardiac catheterization 1992  . Cervical discectomy [id number]  . Spinal cord decompression     C4  . Back surgery    History  Substance Use Topics  . Smoking status: Former Smoker    Quit date: 04/16/2006  . Smokeless tobacco: Never Used  . Alcohol Use: No   Family History  Problem Relation Age of Onset  . Stroke Mother   . Alzheimer's disease Mother    Allergies  Allergen Reactions  . Quinidine    Current Outpatient Prescriptions on File Prior to Visit  Medication Sig Dispense Refill  . amiodarone (PACERONE) 200 MG tablet Take 1 tablet (200 mg total) by mouth daily.  30 tablet  3  . clotrimazole (LOTRIMIN) 1 % cream Apply topically 2 (two) times daily. Apply to groin area twice a day  30 g  0  . dabigatran (PRADAXA)  150 MG CAPS Take 1 capsule (150 mg total) by mouth 2 (two) times daily.  60 capsule  3  . dutasteride (AVODART) 0.5 MG capsule Take 0.5 mg by mouth 2 (two) times a week.        . furosemide (LASIX) 40 MG tablet Take 1 tablet (40 mg total) by mouth 2 (two) times daily as needed.  60 tablet  6  . gabapentin (NEURONTIN) 600 MG tablet Take 1 tablet (600 mg total) by mouth 3 (three) times daily.  90 tablet  1  . glipiZIDE (GLUCOTROL) 10 MG tablet Take 1 tablet (10 mg total) by mouth daily.  90 tablet  2  . insulin glargine (LANTUS) 100 UNIT/ML injection Inject 20-30 Units into the skin at bedtime.      . metFORMIN (GLUCOPHAGE) 500 MG tablet Take 750 mg by mouth 2 (two) times daily with a meal.      . pravastatin (PRAVACHOL) 20 MG tablet Take 1 tablet (20 mg total) by mouth daily.  90 tablet  3  . Tamsulosin HCl (FLOMAX) 0.4 MG CAPS Take by mouth daily.              Review of Systems Review of Systems  Constitutional: Negative for  fever, appetite change, and unexpected weight change. still generally fatigued and weak from hosp but overall better  Eyes: Negative for pain and visual disturbance.  Respiratory: Negative for shortness of breath and wheeze, mild cough has also resolved  Cardiovascular: Negative for cp or palpitations    Gastrointestinal: Negative for nausea, diarrhea and constipation.  Genitourinary: Negative for urgency and frequency. neg for dysuria/ pos for occ urinary retention Skin: Negative for pallor or rash   MSK pos for chronic R knee pain  Neurological: Negative for weakness, light-headedness, numbness and headaches.  Hematological: Negative for adenopathy. Does not bruise/bleed easily.  Psychiatric/Behavioral: Negative for dysphoric mood. The patient is not nervous/anxious.         Objective:   Physical Exam  Constitutional: He appears well-developed and well-nourished. No distress.  HENT:  Head: Normocephalic and atraumatic.  Mouth/Throat: Oropharynx is clear and moist.  Eyes: Conjunctivae and EOM are normal. Pupils are equal, round, and reactive to light.  Neck: Normal range of motion. Neck supple. No JVD present. Carotid bruit is not present. No thyromegaly present.  Cardiovascular: Normal rate and regular rhythm.   Pulmonary/Chest: Effort normal and breath sounds normal. No respiratory distress. He has no wheezes.  Abdominal: Soft. Bowel sounds are normal. He exhibits no distension, no abdominal bruit and no mass. There is no tenderness. There is no rebound and no guarding.       No suprapubic tenderness    Musculoskeletal: He exhibits edema and tenderness.       Baseline 1 plus ankle edema R knee- tender medially with some crepitus on passive rom  No cva tenderness   Lymphadenopathy:    He has no cervical adenopathy.  Neurological: He is alert. He has normal reflexes. No cranial nerve deficit. He exhibits normal muscle tone. Coordination normal.  Skin: Skin is warm and dry. No rash  noted. No erythema. No pallor.  Psychiatric: He has a normal mood and affect.          Assessment & Plan:

## 2011-10-26 NOTE — Assessment & Plan Note (Signed)
Pt suspects long term OA  Was asking for pain med but curious if injection would help Will ref to Dr Patsy Lager for further eval

## 2011-10-26 NOTE — Assessment & Plan Note (Addendum)
Rev hospital records- had klebsiella bacteremia Feeling much better  Urine micro did show some bacteria - so sent for cx  Pt does self cath On proph macrobid (? ) now - will make sure Pend cx

## 2011-10-29 LAB — URINE CULTURE: Colony Count: >=100000

## 2011-10-30 ENCOUNTER — Telehealth: Payer: Self-pay | Admitting: Family Medicine

## 2011-10-30 NOTE — Telephone Encounter (Signed)
Scheduled patient for 10/31/11 @8 :30

## 2011-10-30 NOTE — Telephone Encounter (Signed)
Caller: Sharon/Care Giver/HCPOA; PCP: Tower, Marne A.; CB#: [phone]; Call regarding Question About Medication and Appt; States he was called by office staff 10/30/11 and told to come in for an injection.  She is asking what medication is it and when is the appt.  Per Epic, advised Dr Milinda Antis requested he be notified that "his urine still grows klebsiella on culture and it is resistant to both macrobid and also levaquin and some other medicines. I need him to come in for a shot of ceftriaxone if we have it -- that is rocephin 1 gram please ."  Per Epic, appt for injection is 11/06/11.  Jasmine December, who is also an Charity fundraiser, is asking why it is scheduled for 1 week from now instead of 10/30/11 and if she could pick up medication to administer at home?  Info noted and sent to  [facility] for call back.

## 2011-10-30 NOTE — Telephone Encounter (Signed)
I would like him to get the shot asap - please make him appt for tomorrow - he needs this antibiotic now  Make sure we have the rocephin please

## 2011-10-31 ENCOUNTER — Ambulatory Visit (INDEPENDENT_AMBULATORY_CARE_PROVIDER_SITE_OTHER): Payer: Medicare Other | Admitting: Family Medicine

## 2011-10-31 ENCOUNTER — Telehealth: Payer: Self-pay | Admitting: Family Medicine

## 2011-10-31 DIAGNOSIS — N390 Urinary tract infection, site not specified: Secondary | ICD-10-CM

## 2011-10-31 MED ORDER — CEFTRIAXONE SODIUM 1 G IJ SOLR
1.0000 g | Freq: Once | INTRAMUSCULAR | Status: AC
  Administered 2011-10-31: 1 g via INTRAMUSCULAR

## 2011-10-31 NOTE — Telephone Encounter (Signed)
Spoke with patient put him on nurse schedule to come in 11/01/11 and 11/02/11 to get rocephine injection and also made him an appointment for follow up on 11/05/11.

## 2011-10-31 NOTE — Progress Notes (Signed)
Patient here for Rocephin injection per Dr.Tower.

## 2011-10-31 NOTE — Telephone Encounter (Signed)
Thanks

## 2011-10-31 NOTE — Telephone Encounter (Signed)
I spoke to pt to check on him this afternoon after his rocephin shot this am for Klebsiella uti He is tired and weak feeling - no fever or other symptoms, and does not feel sick "like when I was in the hospital" We disc his urine cx with mult drug resistance (this was sent to his urologist)  Since it is sensitive to rocephin - I want to continue to give doses  Please call him to schedule him to get a dose both tomorrow and also Friday (he requests early am and given in his car due to his transportation issues)- please schedule in whatever way works  Rocephin 1 gram times one Thursday, and times one Friday please  I adv him that if he feels any worse at all / esp if fever/ to call and then go to the ER for eval  He voiced understanding of this  Please schedule him for f/u with me on Monday Thanks

## 2011-11-01 ENCOUNTER — Ambulatory Visit (INDEPENDENT_AMBULATORY_CARE_PROVIDER_SITE_OTHER): Payer: Medicare Other | Admitting: *Deleted

## 2011-11-01 DIAGNOSIS — N390 Urinary tract infection, site not specified: Secondary | ICD-10-CM

## 2011-11-01 MED ORDER — CEFTRIAXONE SODIUM 1 G IJ SOLR
1.0000 g | Freq: Once | INTRAMUSCULAR | Status: AC
  Administered 2011-11-01: 1 g via INTRAMUSCULAR

## 2011-11-01 MED ORDER — CEFTRIAXONE SODIUM 1 G IJ SOLR: 1.0000 g | Freq: Once | INTRAMUSCULAR | Status: DC

## 2011-11-01 NOTE — Telephone Encounter (Signed)
Ok will let him know on tomorrow.

## 2011-11-01 NOTE — Telephone Encounter (Signed)
Patient came to office today to get 2nd rocephin shot, he was looking much better than day before, he stated he was starting to feel a little better he will be here in the morning to get his 3rd injection and he also wanted to know if his son could just drop off his urine Monday instead of him coming in for a follow up.

## 2011-11-01 NOTE — Telephone Encounter (Signed)
I need to see him Monday  So glad he is feeling better !

## 2011-11-02 ENCOUNTER — Ambulatory Visit (INDEPENDENT_AMBULATORY_CARE_PROVIDER_SITE_OTHER): Payer: Medicare Other | Admitting: *Deleted

## 2011-11-02 DIAGNOSIS — N390 Urinary tract infection, site not specified: Secondary | ICD-10-CM

## 2011-11-02 MED ORDER — CEFTRIAXONE SODIUM 1 G IJ SOLR
1.0000 g | Freq: Once | INTRAMUSCULAR | Status: AC
  Administered 2011-11-02: 1 g via INTRAMUSCULAR

## 2011-11-05 ENCOUNTER — Encounter: Payer: Self-pay | Admitting: Family Medicine

## 2011-11-05 ENCOUNTER — Ambulatory Visit (INDEPENDENT_AMBULATORY_CARE_PROVIDER_SITE_OTHER): Payer: Medicare Other | Admitting: Family Medicine

## 2011-11-05 VITALS — BP 116/72 | HR 67 | Temp 97.4°F | Ht 72.0 in

## 2011-11-05 DIAGNOSIS — R531 Weakness: Secondary | ICD-10-CM | POA: Insufficient documentation

## 2011-11-05 DIAGNOSIS — R5383 Other fatigue: Secondary | ICD-10-CM

## 2011-11-05 DIAGNOSIS — N390 Urinary tract infection, site not specified: Secondary | ICD-10-CM

## 2011-11-05 LAB — POCT URINALYSIS DIPSTICK
Glucose, UA: 100
Ketones, UA: NEGATIVE
Protein, UA: NEGATIVE
Urobilinogen, UA: 0.2

## 2011-11-05 LAB — POCT UA - MICROSCOPIC ONLY

## 2011-11-05 LAB — CBC WITH DIFFERENTIAL/PLATELET
Basophils Relative: 0.5 % (ref 0.0–3.0)
Eosinophils Relative: 1.4 % (ref 0.0–5.0)
Hemoglobin: 12 g/dL — ABNORMAL LOW (ref 13.0–17.0)
Lymphocytes Relative: 15.5 % (ref 12.0–46.0)
Neutrophils Relative %: 73.7 % (ref 43.0–77.0)
RBC: 3.79 Mil/uL — ABNORMAL LOW (ref 4.22–5.81)
WBC: 6.1 10*3/uL (ref 4.5–10.5)

## 2011-11-05 MED ORDER — CEFTRIAXONE SODIUM 1 G IJ SOLR
1.0000 g | Freq: Once | INTRAMUSCULAR | Status: AC
  Administered 2011-11-05: 1 g via INTRAMUSCULAR

## 2011-11-05 NOTE — Progress Notes (Signed)
Subjective:    Patient ID: Jose Murray, male    DOB: 1940-06-04, 71 y.o.   MRN: [id number]  HPI Here for f/u of uti   Was on proph abx from his urologist -macrobid Has klebsiella - resistant to that  Multi drug resistant klebsiella-- has been hosp for bacteremia with this shortly before Felt fine at last visit and then began to feel sick (general malaise)   tx with rocephin shots 1 g - times 3  Felt a whole lot better - now is just feeling sick - just weak now (never fully got his strength back after his hosp)  ? If will see this urologist again -- sees Dr Achilles Dunk  Neurogenic bladder from a spinal cord injury Also prostate is swelled  Hard to cath himself at times - and that is difficult   Patient Active Problem List  Diagnosis  . DIABETES MELLITUS, TYPE II  . HYPERLIPIDEMIA  . RBB W/ LFAB  . Atrial fibrillation  . ATRIAL FLUTTER  . SYSTOLIC HEART FAILURE, CHRONIC  . CAROTID OCCLUSIVE DISEASE  . COPD  . URINARY TRACT INFECTION, CHRONIC  . URINARY RETENTION  . MICROALBUMINURIA  . SPINAL CORD INJURY  . TOBACCO USE, QUIT  . Hyperkalemia  . Chronic shoulder pain  . Blurry vision, right eye  . Pedal edema  . Fall  . Fever and chills  . Anemia  . UTI (lower urinary tract infection)  . Neck pain  . Pneumonia due to Klebsiella pneumoniae  . Right knee pain  . Weakness generalized   Past Medical History  Diagnosis Date  . Atrial fibrillation   . COPD (chronic obstructive pulmonary disease)   . Diabetes mellitus   . Hyperlipidemia   . Spinal cord injury     wheelchair bound  . Neurogenic bladder     occ infections  . Carotid stenosis   . Chronic UTI    Past Surgical History  Procedure Date  . Eye surgery     cataract extraction  . Orchiectomy     left, secondary to torsion  . X-stop implantation 1998    decompression, spinal surgery 04/2003  . Cervical spine surgery     bone graft  . Cardiac catheterization 1992  . Cervical discectomy [id number]  .  Spinal cord decompression     C4  . Back surgery    History  Substance Use Topics  . Smoking status: Former Smoker    Quit date: 04/16/2006  . Smokeless tobacco: Never Used  . Alcohol Use: No   Family History  Problem Relation Age of Onset  . Stroke Mother   . Alzheimer's disease Mother    Allergies  Allergen Reactions  . Quinidine    Current Outpatient Prescriptions on File Prior to Visit  Medication Sig Dispense Refill  . amiodarone (PACERONE) 200 MG tablet Take 1 tablet (200 mg total) by mouth daily.  30 tablet  3  . clotrimazole (LOTRIMIN) 1 % cream Apply topically 2 (two) times daily. Apply to groin area twice a day  30 g  0  . dabigatran (PRADAXA) 150 MG CAPS Take 1 capsule (150 mg total) by mouth 2 (two) times daily.  60 capsule  3  . dutasteride (AVODART) 0.5 MG capsule Take 0.5 mg by mouth 2 (two) times a week.        . furosemide (LASIX) 40 MG tablet Take 1 tablet (40 mg total) by mouth 2 (two) times daily as needed.  60 tablet  6  .  gabapentin (NEURONTIN) 600 MG tablet Take 1 tablet (600 mg total) by mouth 3 (three) times daily.  90 tablet  1  . glipiZIDE (GLUCOTROL) 10 MG tablet Take 1 tablet (10 mg total) by mouth daily.  90 tablet  2  . insulin glargine (LANTUS) 100 UNIT/ML injection Inject 20-30 Units into the skin at bedtime.      Marland Kitchen levofloxacin (LEVAQUIN) 750 MG tablet Take 750 mg by mouth daily.      . metFORMIN (GLUCOPHAGE) 500 MG tablet Take 750 mg by mouth 2 (two) times daily with a meal.      . pravastatin (PRAVACHOL) 20 MG tablet Take 1 tablet (20 mg total) by mouth daily.  90 tablet  3  . Tamsulosin HCl (FLOMAX) 0.4 MG CAPS Take by mouth daily.         No current facility-administered medications on file prior to visit.         Review of Systems ROS Review of Systems  Constitutional: Negative for fever, appetite change, and unexpected weight change. pos for fatigue/ generalized weakness Eyes: Negative for pain and visual disturbance.    Respiratory: Negative for cough and shortness of breath.   Cardiovascular: Negative for cp or palpitations    Gastrointestinal: Negative for nausea, diarrhea and constipation.  Genitourinary: pos for baseline incontinence and lack of bladder sensation/ pos for urinary retention from BPH, neg for blood in urine  Skin: Negative for pallor or rash   Neurological: Negative for weakness, light-headedness, numbness and headaches.  Hematological: Negative for adenopathy. Does not bruise/bleed easily.  Psychiatric/Behavioral: Negative for dysphoric mood. The patient is not nervous/anxious.         Objective:   Physical Exam  Constitutional: He appears well-developed and well-nourished. No distress.       Wheelchair bound  HENT:  Head: Normocephalic and atraumatic.  Eyes: Conjunctivae and EOM are normal. Pupils are equal, round, and reactive to light. No scleral icterus.  Neck: Normal range of motion. Neck supple. No JVD present. Carotid bruit is not present. No thyromegaly present.  Cardiovascular: Normal rate, regular rhythm, normal heart sounds and intact distal pulses.  Exam reveals no gallop.   Pulmonary/Chest: Effort normal and breath sounds normal. No respiratory distress. He has no wheezes.  Abdominal: Soft. Bowel sounds are normal. He exhibits no distension, no abdominal bruit and no mass. There is no tenderness. There is no rebound and no guarding.  Musculoskeletal: He exhibits edema. He exhibits no tenderness.       Baseline LE edema unchanged   No cva tenderness  Lymphadenopathy:    He has no cervical adenopathy.  Neurological: He is alert. He exhibits abnormal muscle tone.  Skin: Skin is warm and dry. No rash noted. No erythema. No pallor.  Psychiatric: He has a normal mood and affect.          Assessment & Plan:

## 2011-11-05 NOTE — Assessment & Plan Note (Addendum)
Klebsiella multi drug resistant - was hosp for bacteremia recently  tx with rocephin injections  ucx today Blood cx today  Ref to urol for further eval- sees Dr Achilles Dunk Rocephin 1 g given today

## 2011-11-05 NOTE — Patient Instructions (Addendum)
Labs today Urine culture today  We will do urology referral at check out  Rocephin shot today  Update me if symptoms change

## 2011-11-05 NOTE — Assessment & Plan Note (Signed)
Ever since hospitalization with klebsiella bacteremia (pneumonia and uti suspected) Improved since starting rocephin shots at this time , but still weak When infection is resolved -consider home PT  For urology visit  Labs /blood cx/ ucx pend Update if not starting to improve in a week or if worsening

## 2011-11-06 ENCOUNTER — Telehealth: Payer: Self-pay | Admitting: Family Medicine

## 2011-11-06 ENCOUNTER — Ambulatory Visit: Payer: Medicare Other

## 2011-11-06 NOTE — Telephone Encounter (Signed)
Thank you, that sounds like a good plan.

## 2011-11-06 NOTE — Telephone Encounter (Signed)
Dr Copes office called to say that Dr Achilles Dunk is going to have Jose Murray come into their office on 11/14/2011 for a Cath"d specimen. They want him to be off any antibiotics for 7-10 days and they want to check for true infection or if specimens were cross contaminated by doing the cath"d specimen. They will call Jose Murray today to tell him when to come to their office. Told them we would fax copies of the urine culture we took yesterday when results come in.

## 2011-11-07 LAB — URINE CULTURE: Colony Count: 2000

## 2011-12-07 ENCOUNTER — Other Ambulatory Visit: Payer: Self-pay | Admitting: Family Medicine

## 2011-12-07 NOTE — Telephone Encounter (Signed)
Request for Gabapentin 600 mg . Last OV was 7/13. Ok to refill?

## 2011-12-07 NOTE — Telephone Encounter (Signed)
Will refill electronically  

## 2011-12-10 ENCOUNTER — Other Ambulatory Visit: Payer: Self-pay | Admitting: *Deleted

## 2011-12-10 DIAGNOSIS — R609 Edema, unspecified: Secondary | ICD-10-CM

## 2011-12-10 MED ORDER — FUROSEMIDE 40 MG PO TABS: 40.0000 mg | ORAL_TABLET | Freq: Two times a day (BID) | ORAL | Status: DC | PRN

## 2011-12-10 NOTE — Telephone Encounter (Signed)
Refilled Furosemide. 

## 2012-01-07 ENCOUNTER — Other Ambulatory Visit: Payer: Self-pay | Admitting: *Deleted

## 2012-01-07 ENCOUNTER — Other Ambulatory Visit: Payer: Self-pay

## 2012-01-07 MED ORDER — DABIGATRAN ETEXILATE MESYLATE 150 MG PO CAPS: 150.0000 mg | ORAL_CAPSULE | Freq: Two times a day (BID) | ORAL | Status: DC

## 2012-01-07 NOTE — Telephone Encounter (Signed)
Refilled Pradaxa. 

## 2012-01-28 ENCOUNTER — Other Ambulatory Visit: Payer: Self-pay | Admitting: Family Medicine

## 2012-01-28 ENCOUNTER — Other Ambulatory Visit: Payer: Self-pay | Admitting: *Deleted

## 2012-01-28 MED ORDER — AMIODARONE HCL 200 MG PO TABS: 200.0000 mg | ORAL_TABLET | Freq: Every day | ORAL | Status: DC

## 2012-01-28 NOTE — Telephone Encounter (Signed)
Refilled Amiodarone. 

## 2012-02-05 ENCOUNTER — Encounter: Payer: Self-pay | Admitting: Cardiovascular Disease

## 2012-02-05 ENCOUNTER — Ambulatory Visit (INDEPENDENT_AMBULATORY_CARE_PROVIDER_SITE_OTHER): Payer: Medicare Other | Admitting: Cardiovascular Disease

## 2012-02-05 VITALS — BP 104/68 | HR 81 | Ht 72.0 in | Wt 202.0 lb

## 2012-02-05 DIAGNOSIS — R609 Edema, unspecified: Secondary | ICD-10-CM

## 2012-02-05 DIAGNOSIS — Z01810 Encounter for preprocedural cardiovascular examination: Secondary | ICD-10-CM

## 2012-02-05 DIAGNOSIS — I4891 Unspecified atrial fibrillation: Secondary | ICD-10-CM

## 2012-02-05 DIAGNOSIS — I4892 Unspecified atrial flutter: Secondary | ICD-10-CM

## 2012-02-05 MED ORDER — FUROSEMIDE 40 MG PO TABS: 40.0000 mg | ORAL_TABLET | Freq: Two times a day (BID) | ORAL | Status: DC | PRN

## 2012-02-05 NOTE — Progress Notes (Signed)
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 Daleen Squibb. (Had multiple DC-CVs prior to Rhythmol)   Admitted in August 2011 to [facility] with tachycardia and CHF.  atrial fib. Echo showed EF 45% (down from 50-55%) with mild to moderately reduced RV function and diastolic dysfunction. Diuresed and rate controlled. Rhtyhmol stopped. Started on Pradaxa.    in september 2011was started on amiodarone.  Myoview 2006 EF 54%. no ischemia.   Carotid dopplers 8/10: 0-39% L 60-79% R He is here today for preoperative cardiovascular evaluation for bladder surgery. The patient has no history of ischemic heart disease and no prior myocardial infarction. He denies any chest pain or significant dyspnea. He has chronic lower extremity edema likely due to chronic venous insufficiency and nonambulatory status.    Allergies  Allergen Reactions  . Quinidine      Current Outpatient Prescriptions on File Prior to Visit  Medication Sig Dispense Refill  . amiodarone (PACERONE) 200 MG tablet Take 1 tablet (200 mg total) by mouth daily.  30 tablet  3  . clotrimazole (LOTRIMIN) 1 % cream Apply topically 2 (two) times daily. Apply to groin area twice a day  30 g  0  . dabigatran (PRADAXA) 150 MG CAPS Take 1 capsule (150 mg total) by mouth 2 (two) times daily.  60 capsule  3  . dutasteride (AVODART) 0.5 MG capsule Take 0.5 mg by mouth 2 (two) times a week.        . gabapentin (NEURONTIN) 600 MG tablet TAKE ONE (1) TABLET BY MOUTH            3 TIMES DAILY  90 tablet  5  . glipiZIDE (GLUCOTROL) 10 MG tablet TAKE ONE (1) TABLET BY MOUTH EVERY      DAY  90 tablet  0  . insulin glargine (LANTUS) 100 UNIT/ML injection Inject 20-30 Units into the skin at bedtime.      Marland Kitchen levofloxacin (LEVAQUIN) 750 MG tablet Take 750  mg by mouth daily.      . metFORMIN (GLUCOPHAGE) 500 MG tablet Take 750 mg by mouth 2 (two) times daily with a meal.      . pravastatin (PRAVACHOL) 20 MG tablet Take 1 tablet (20 mg total) by mouth daily.  90 tablet  3  . Tamsulosin HCl (FLOMAX) 0.4 MG CAPS Take by mouth daily.        Marland Kitchen DISCONTD: furosemide (LASIX) 40 MG tablet Take 1 tablet (40 mg total) by mouth 2 (two) times daily as needed.  60 tablet  6     Past Medical History  Diagnosis Date  . Atrial fibrillation   . COPD (chronic obstructive pulmonary disease)   . Diabetes mellitus   . Hyperlipidemia   . Spinal cord injury     wheelchair bound  . Neurogenic bladder     occ infections  . Carotid stenosis   . Chronic UTI      Past Surgical History  Procedure Date  . Eye surgery     cataract extraction  . Orchiectomy     left, secondary to torsion  . X-stop implantation 1998    decompression, spinal surgery 04/2003  . Cervical spine surgery     bone graft  . Cardiac catheterization 1992  . Cervical discectomy [id number]  .  Spinal cord decompression     C4  . Back surgery   . Abdominal aortic aneurysm repair      Family History  Problem Relation Age of Onset  . Stroke Mother   . Alzheimer's disease Mother      History   Social History  . Marital Status: Legally Separated    Spouse Name: N/A    Number of Children: 3  . Years of Education: N/A   Occupational History  . disabled    Social History Main Topics  . Smoking status: Former Smoker    Quit date: 04/16/2006  . Smokeless tobacco: Never Used  . Alcohol Use: No  . Drug Use: No  . Sexually Active: No   Other Topics Concern  . Not on file   Social History Narrative  . No narrative on file     PHYSICAL EXAM   BP 104/68  Pulse 81  Ht 6' (1.829 m)  Wt 202 lb (91.627 kg)  BMI 27.40 kg/m2  Constitutional: He is oriented to person, place, and time. He appears well-developed and well-nourished. No distress.  HENT: No nasal discharge.    Head: Normocephalic and atraumatic.  Eyes: Pupils are equal and round. Right eye exhibits no discharge. Left eye exhibits no discharge.  Neck: Normal range of motion. Neck supple. No JVD present. No thyromegaly present.  Cardiovascular: Normal rate, regular rhythm, normal heart sounds and. Exam reveals no gallop and no friction rub. No murmur heard.  Pulmonary/Chest: Effort normal and breath sounds normal. No stridor. No respiratory distress. He has no wheezes. He has no rales. He exhibits no tenderness.  Abdominal: Soft. Bowel sounds are normal. He exhibits no distension. There is no tenderness. There is no rebound and no guarding.  Musculoskeletal: Normal range of motion. He exhibits +1 edema and no tenderness.  Neurological: He is alert and oriented to person, place, and time. Coordination normal.  Skin: Skin is warm and dry. No rash noted. He is not diaphoretic. No erythema. No pallor.  Psychiatric: He has a normal mood and affect. His behavior is normal. Judgment and thought content normal.      EKG: Normal sinus rhythm with a PAC -Right bundle branch block with left axis -bifascicular block.   ABNORMAL    ASSESSMENT AND PLAN

## 2012-02-05 NOTE — Patient Instructions (Addendum)
Stop Pradaxa 3 days before the surgery and resume 3 days after the surgery if [facility] with Dr. Achilles Dunk.  Follow up with Dr. Mariah Milling in 6 months (reschedule December appointment).

## 2012-02-05 NOTE — Assessment & Plan Note (Signed)
He is currently in sinus rhythm with amiodarone. Pradaxa can be stopped 3 days before the surgery and resuming 3 days after the surgery as long as he is not having significant hematuria.

## 2012-02-05 NOTE — Assessment & Plan Note (Signed)
The patient has no history of myocardial infarction or ischemic heart disease. He has no symptoms suggestive of angina. Previous ejection fraction was normal. Thus, he is at an overall low to moderate risk for cardiovascular complications mostly due to his limited mobility and functional capacity. No further cardiac testing is needed at this time.

## 2012-02-07 ENCOUNTER — Ambulatory Visit: Payer: Self-pay | Admitting: Urology

## 2012-02-07 LAB — CBC WITH DIFFERENTIAL/PLATELET
Basophil #: 0 10*3/uL (ref 0.0–0.1)
Eosinophil #: 0.2 10*3/uL (ref 0.0–0.7)
Lymphocyte #: 1.1 10*3/uL (ref 1.0–3.6)
MCV: 96 fL (ref 80–100)
Monocyte #: 0.6 x10 3/mm (ref 0.2–1.0)
Monocyte %: 10.5 %
Platelet: 273 10*3/uL (ref 150–440)
RDW: 13.9 % (ref 11.5–14.5)
WBC: 6.1 10*3/uL (ref 3.8–10.6)

## 2012-02-07 LAB — BASIC METABOLIC PANEL
Anion Gap: 9 (ref 7–16)
Calcium, Total: 8.6 mg/dL (ref 8.5–10.1)
Chloride: 101 mmol/L (ref 98–107)
Co2: 26 mmol/L (ref 21–32)
EGFR (African American): >60
Sodium: 136 mmol/L (ref 136–145)

## 2012-02-12 ENCOUNTER — Ambulatory Visit: Payer: Self-pay | Admitting: Urology

## 2012-02-12 LAB — PROTIME-INR
INR: 1
Prothrombin Time: 13.2 secs (ref 11.5–14.7)

## 2012-02-19 ENCOUNTER — Ambulatory Visit: Payer: Medicare Other | Admitting: Family Medicine

## 2012-02-27 ENCOUNTER — Other Ambulatory Visit: Payer: Self-pay | Admitting: Family Medicine

## 2012-03-07 ENCOUNTER — Ambulatory Visit (INDEPENDENT_AMBULATORY_CARE_PROVIDER_SITE_OTHER): Payer: Medicare Other | Admitting: Family Medicine

## 2012-03-07 ENCOUNTER — Encounter: Payer: Self-pay | Admitting: Family Medicine

## 2012-03-07 VITALS — BP 112/68 | HR 74 | Temp 98.1°F | Ht 72.0 in

## 2012-03-07 DIAGNOSIS — E785 Hyperlipidemia, unspecified: Secondary | ICD-10-CM

## 2012-03-07 DIAGNOSIS — R609 Edema, unspecified: Secondary | ICD-10-CM

## 2012-03-07 DIAGNOSIS — G589 Mononeuropathy, unspecified: Secondary | ICD-10-CM

## 2012-03-07 DIAGNOSIS — IMO0002 Reserved for concepts with insufficient information to code with codable children: Secondary | ICD-10-CM

## 2012-03-07 DIAGNOSIS — G629 Polyneuropathy, unspecified: Secondary | ICD-10-CM

## 2012-03-07 DIAGNOSIS — G609 Hereditary and idiopathic neuropathy, unspecified: Secondary | ICD-10-CM

## 2012-03-07 DIAGNOSIS — E119 Type 2 diabetes mellitus without complications: Secondary | ICD-10-CM

## 2012-03-07 MED ORDER — PREGABALIN 150 MG PO CAPS: 150.0000 mg | ORAL_CAPSULE | Freq: Two times a day (BID) | ORAL | Status: DC

## 2012-03-07 MED ORDER — PRAVASTATIN SODIUM 20 MG PO TABS: 20.0000 mg | ORAL_TABLET | Freq: Every day | ORAL | Status: AC

## 2012-03-07 MED ORDER — METFORMIN HCL 500 MG PO TABS: ORAL_TABLET | ORAL | Status: DC

## 2012-03-07 MED ORDER — FUROSEMIDE 40 MG PO TABS: 40.0000 mg | ORAL_TABLET | Freq: Two times a day (BID) | ORAL | Status: DC | PRN

## 2012-03-07 MED ORDER — GLIPIZIDE 10 MG PO TABS: 10.0000 mg | ORAL_TABLET | Freq: Every day | ORAL | Status: AC

## 2012-03-07 NOTE — Patient Instructions (Addendum)
Increase lantus to 40 units daily  Try lyrica instead of gabapendin,  150 mg twice daily- I sent that to [facility] We will refill other medicines for 90 day supplies with [facility] today - then we will instruct you further  If you pain continues (deep pain) - I may consider holding your pravastatin for a while

## 2012-03-07 NOTE — Progress Notes (Signed)
Subjective:    Patient ID: Jose Murray, male    DOB: 15-Apr-1941, 71 y.o.   MRN: [id number]  HPI Here for f/u of chronic medical problems  He feels terrible  Tired and weak Does not think gabapentin is not working as well for his neuropathy  Higher doses do not help at all  Burns badly/ tingles through whole lower body  Fingers are ok  His fingers really draw up -more and more  (has worked on this with PT in the past)   More and more pain along with the neuropathy  Aches all night long - and using a topical pain product otc   Diabetes Home sugar results -not good at all  Worse since his bladder problems got worse again  200-300 at night, and then in the ams 118-160s  DM diet -no change at all  Exercise -not able to do much at all  lantus 30 units at night  Symptoms-none A1C last  Lab Results  Component Value Date   HGBA1C 7.2* 10/02/2011    No problems with medications  Renal protection Last eye exam - 2 months ago  Was told he had a "stoke" in his L eye    Urinary problems are worse - more trouble urinating on his own - has to cath all the time Had a procedure  No infections     In wheelchair- cannot weigh   Hyperlipidemia  Lab Results  Component Value Date   CHOL 140 07/26/2010   CHOL 108 01/23/2010   CHOL 125 04/06/2009   Lab Results  Component Value Date   HDL 47.20 07/26/2010   HDL 84.69* 01/23/2010   HDL 37.80* 04/06/2009   Lab Results  Component Value Date   LDLCALC 69 07/26/2010   LDLCALC 40 01/23/2010   LDLCALC 51 04/06/2009   Lab Results  Component Value Date   TRIG 119.0 07/26/2010   TRIG 191.0* 01/23/2010   TRIG 179.0* 04/06/2009   Lab Results  Component Value Date   CHOLHDL 3 07/26/2010   CHOLHDL 4 01/23/2010   CHOLHDL 3 04/06/2009   No results found for this basename: LDLDIRECT     Patient Active Problem List  Diagnosis  . DIABETES MELLITUS, TYPE II  . HYPERLIPIDEMIA  . RBB W/ LFAB  . Atrial fibrillation  . ATRIAL  FLUTTER  . SYSTOLIC HEART FAILURE, CHRONIC  . CAROTID OCCLUSIVE DISEASE  . COPD  . URINARY TRACT INFECTION, CHRONIC  . URINARY RETENTION  . MICROALBUMINURIA  . SPINAL CORD INJURY  . TOBACCO USE, QUIT  . Hyperkalemia  . Chronic shoulder pain  . Blurry vision, right eye  . Pedal edema  . Fall  . Fever and chills  . Anemia  . UTI (lower urinary tract infection)  . Neck pain  . Pneumonia due to Klebsiella pneumoniae  . Right knee pain  . Weakness generalized  . Preop cardiovascular exam   Past Medical History  Diagnosis Date  . Atrial fibrillation   . COPD (chronic obstructive pulmonary disease)   . Diabetes mellitus   . Hyperlipidemia   . Spinal cord injury     wheelchair bound  . Neurogenic bladder     occ infections  . Carotid stenosis   . Chronic UTI    Past Surgical History  Procedure Date  . Eye surgery     cataract extraction  . Orchiectomy     left, secondary to torsion  . X-stop implantation 1998    decompression, spinal surgery  04/2003  . Cervical spine surgery     bone graft  . Cardiac catheterization 1992  . Cervical discectomy [id number]  . Spinal cord decompression     C4  . Back surgery   . Abdominal aortic aneurysm repair    History  Substance Use Topics  . Smoking status: Former Smoker    Quit date: 04/16/2006  . Smokeless tobacco: Never Used  . Alcohol Use: No   Family History  Problem Relation Age of Onset  . Stroke Mother   . Alzheimer's disease Mother    Allergies  Allergen Reactions  . Quinidine    Current Outpatient Prescriptions on File Prior to Visit  Medication Sig Dispense Refill  . amiodarone (PACERONE) 200 MG tablet Take 1 tablet (200 mg total) by mouth daily.  30 tablet  3  . dabigatran (PRADAXA) 150 MG CAPS Take 1 capsule (150 mg total) by mouth 2 (two) times daily.  60 capsule  3  . dutasteride (AVODART) 0.5 MG capsule Take 0.5 mg by mouth every other day.       . furosemide (LASIX) 40 MG tablet Take 1 tablet (40 mg  total) by mouth 2 (two) times daily as needed.  60 tablet  6  . gabapentin (NEURONTIN) 600 MG tablet TAKE ONE (1) TABLET BY MOUTH            3 TIMES DAILY  90 tablet  5  . glipiZIDE (GLUCOTROL) 10 MG tablet TAKE ONE (1) TABLET BY MOUTH EVERY      DAY  90 tablet  0  . insulin glargine (LANTUS) 100 UNIT/ML injection Inject 20-30 Units into the skin at bedtime.      . metFORMIN (GLUCOPHAGE) 500 MG tablet TAKE 1 AND 1/2 TABLETS BY MOUTH TWICE A DAY  45 tablet  0  . pravastatin (PRAVACHOL) 20 MG tablet TAKE ONE (1) TABLET BY MOUTH EVERY      DAY  30 tablet  0  . Tamsulosin HCl (FLOMAX) 0.4 MG CAPS Take by mouth daily.          Review of Systems Review of Systems  Constitutional: Negative for fever, appetite change, fatigue and unexpected weight change.  Eyes: Negative for pain and visual disturbance.  Respiratory: Negative for cough and shortness of breath.   Cardiovascular: Negative for cp or palpitations    Gastrointestinal: Negative for nausea, diarrhea and constipation.  Genitourinary: Negative for urgency and frequency.  Skin: Negative for pallor or rash   MSK pos for leg pain / lower body aching  Neurological: Negative for weakness, light-headedness, and headaches. pos for tingling pain in lower extremeties that is severe Hematological: Negative for adenopathy. Does not bruise/bleed easily.  Psychiatric/Behavioral: Negative for dysphoric mood. The patient is not nervous/anxious.  is stressed due to the discomfort he is in       Objective:   Physical Exam  Constitutional: He appears well-developed and well-nourished. No distress.       In wheelchair- well appearing but saying he is miserable   HENT:  Head: Normocephalic and atraumatic.  Mouth/Throat: Oropharynx is clear and moist.  Eyes: Conjunctivae normal and EOM are normal. Pupils are equal, round, and reactive to light.  Neck: Normal range of motion. Neck supple. No JVD present. No thyromegaly present.  Cardiovascular: Normal  rate, regular rhythm and normal heart sounds.   No murmur heard.      Difficult to feel pulses with supp hose today- however edema is much improved  Pulmonary/Chest: Effort normal  and breath sounds normal. No respiratory distress. He has no wheezes.  Abdominal: Soft. Bowel sounds are normal. He exhibits no distension and no mass. There is no tenderness.  Musculoskeletal: He exhibits no edema.  Lymphadenopathy:    He has no cervical adenopathy.  Neurological: He is alert. He has normal reflexes.  Skin: Skin is warm and dry. No rash noted. No erythema. No pallor.  Psychiatric: His behavior is normal.       Mood is generally negative/ down / many complaints           Assessment & Plan:

## 2012-03-08 LAB — LIPID PANEL
Cholesterol: 150 mg/dL (ref 0–200)
HDL: 37 mg/dL — ABNORMAL LOW (ref >39)
LDL Cholesterol: 56 mg/dL (ref 0–99)
Triglycerides: 286 mg/dL — ABNORMAL HIGH (ref <150)

## 2012-03-08 LAB — CBC WITH DIFFERENTIAL/PLATELET
Eosinophils Absolute: 0.1 10*3/uL (ref 0.0–0.7)
Eosinophils Relative: 2 % (ref 0–5)
Lymphs Abs: 1.4 10*3/uL (ref 0.7–4.0)
MCH: 31 pg (ref 26.0–34.0)
MCV: 93.1 fL (ref 78.0–100.0)
Monocytes Absolute: 0.8 10*3/uL (ref 0.1–1.0)
Platelets: 285 10*3/uL (ref 150–400)
RBC: 3.78 MIL/uL — ABNORMAL LOW (ref 4.22–5.81)
RDW: 13.2 % (ref 11.5–15.5)

## 2012-03-08 LAB — COMPREHENSIVE METABOLIC PANEL
ALT: 12 U/L (ref 0–53)
BUN: 21 mg/dL (ref 6–23)
CO2: 27 mEq/L (ref 19–32)
Creat: 1.1 mg/dL (ref 0.50–1.35)
Total Bilirubin: 0.3 mg/dL (ref 0.3–1.2)

## 2012-03-08 LAB — HEMOGLOBIN A1C
Hgb A1c MFr Bld: 8.1 % — ABNORMAL HIGH (ref <5.7)
Mean Plasma Glucose: 186 mg/dL — ABNORMAL HIGH (ref <117)

## 2012-03-09 NOTE — Assessment & Plan Note (Signed)
DM is not optimally controlled - worse with high glucose readings and  Lab Results  Component Value Date   HGBA1C 8.1* 03/07/2012    Pt states there is no change in diet- but he is in lots of pain and not as active and also has had more problems since he was in the hospital/ also more urinary retention Inc lantus to 40units and pending labs  No hypoglycemia

## 2012-03-09 NOTE — Assessment & Plan Note (Signed)
Worse overall lower body pain since hosp in June Changing to lyrica- but if not imp consider f/u Dr Jeral Fruit

## 2012-03-09 NOTE — Assessment & Plan Note (Signed)
Due for check on pravachol Per pt - has had some vascular issues with his eye  If body pain persists - ? Consider holding statin for a while- but he does need it

## 2012-03-09 NOTE — Assessment & Plan Note (Addendum)
This is much worse lately-  Likely due to worse DM and stressors - also summer hosp for bacteremia  Had imaging of neck at that time Will change from gabapentin to lyrica 150 bid and see if this improves things If not - f/u with Dr Jeral Fruit may be warranted

## 2012-03-10 ENCOUNTER — Telehealth: Payer: Self-pay | Admitting: Family Medicine

## 2012-03-10 NOTE — Telephone Encounter (Signed)
Has he started the lyrica yet? - if so is it helping his symptoms?

## 2012-03-10 NOTE — Telephone Encounter (Signed)
When I called pt about his labs he wanted me to let you know he is still not sleeping at night and wanted to know if you could prescribed him something to help, please advise

## 2012-03-11 ENCOUNTER — Telehealth: Payer: Self-pay | Admitting: Family Medicine

## 2012-03-11 MED ORDER — ZOLPIDEM TARTRATE 10 MG PO TABS: 5.0000 mg | ORAL_TABLET | Freq: Every evening | ORAL | Status: DC | PRN

## 2012-03-11 NOTE — Telephone Encounter (Signed)
Caller: Antar/Patient; Phone: [phone]; Reason for Call: Patient returning call to office.  Patient had requested Rx for sleep; per Epic, Dr.  Milinda Antis wants to know if he started the lyrica yet, and if it is helping at all.  States he did start the lyrica, and it has started to help during the day with his hand pain, but states he is having trouble sleeping and wants her to consider medication to assist with this if possible.   May reach patient [phone].  Krs/can

## 2012-03-11 NOTE — Telephone Encounter (Signed)
Left voicemail requesting pt to call office will try to call back later 

## 2012-03-11 NOTE — Telephone Encounter (Signed)
Pt called [facility] and Dr. Milinda Antis prescribed Rx to help

## 2012-03-11 NOTE — Telephone Encounter (Signed)
Pt notified and advise to use med with caution, and called in Rx as prescribed

## 2012-03-11 NOTE — Telephone Encounter (Signed)
Can try Remus Loffler- use with caution - 1/2 pill at first- if not helpful then try a whole pill , as with all sleep medicines there is a risk of habit so try to use only when needed , and also increased risk of falls  Px written for call in   Let me know how it works please

## 2012-03-19 ENCOUNTER — Inpatient Hospital Stay: Payer: Self-pay | Admitting: Internal Medicine

## 2012-03-19 LAB — URINALYSIS, COMPLETE
Glucose,UR: NEGATIVE mg/dL (ref 0–75)
Ketone: NEGATIVE
Ph: 5 (ref 4.5–8.0)
RBC,UR: 96 /HPF (ref 0–5)
Specific Gravity: 1.012 (ref 1.003–1.030)
Squamous Epithelial: <1

## 2012-03-19 LAB — CBC
HCT: 36.8 % — ABNORMAL LOW (ref 40.0–52.0)
MCH: 32 pg (ref 26.0–34.0)
MCHC: 33.3 g/dL (ref 32.0–36.0)
MCV: 96 fL (ref 80–100)
RDW: 14 % (ref 11.5–14.5)

## 2012-03-19 LAB — COMPREHENSIVE METABOLIC PANEL
Alkaline Phosphatase: 89 U/L (ref 50–136)
BUN: 29 mg/dL — ABNORMAL HIGH (ref 7–18)
Creatinine: 1.38 mg/dL — ABNORMAL HIGH (ref 0.60–1.30)
EGFR (African American): 59 — ABNORMAL LOW
EGFR (Non-African Amer.): 51 — ABNORMAL LOW
Osmolality: 285 (ref 275–301)
SGOT(AST): 20 U/L (ref 15–37)

## 2012-03-20 LAB — CBC WITH DIFFERENTIAL/PLATELET
Basophil #: 0 10*3/uL (ref 0.0–0.1)
Eosinophil #: 0 10*3/uL (ref 0.0–0.7)
Eosinophil %: 0 %
HCT: 32.6 % — ABNORMAL LOW (ref 40.0–52.0)
Lymphocyte #: 0.6 10*3/uL — ABNORMAL LOW (ref 1.0–3.6)
Lymphocyte %: 2.8 %
MCH: 32.7 pg (ref 26.0–34.0)
MCHC: 33.8 g/dL (ref 32.0–36.0)
MCV: 97 fL (ref 80–100)
Monocyte #: 1.3 x10 3/mm — ABNORMAL HIGH (ref 0.2–1.0)
Monocyte %: 5.8 %
Neutrophil %: 91.3 %
Platelet: 179 10*3/uL (ref 150–440)
RBC: 3.37 10*6/uL — ABNORMAL LOW (ref 4.40–5.90)
RDW: 14.1 % (ref 11.5–14.5)

## 2012-03-20 LAB — BASIC METABOLIC PANEL
Anion Gap: 8 (ref 7–16)
Calcium, Total: 8.3 mg/dL — ABNORMAL LOW (ref 8.5–10.1)
Creatinine: 1.91 mg/dL — ABNORMAL HIGH (ref 0.60–1.30)
EGFR (African American): 40 — ABNORMAL LOW
EGFR (Non-African Amer.): 34 — ABNORMAL LOW
Osmolality: 285 (ref 275–301)
Potassium: 4.8 mmol/L (ref 3.5–5.1)

## 2012-03-21 LAB — CBC WITH DIFFERENTIAL/PLATELET
Basophil #: 0 10*3/uL (ref 0.0–0.1)
Eosinophil #: 0 10*3/uL (ref 0.0–0.7)
HCT: 29.6 % — ABNORMAL LOW (ref 40.0–52.0)
Lymphocyte #: 0.5 10*3/uL — ABNORMAL LOW (ref 1.0–3.6)
MCH: 32.3 pg (ref 26.0–34.0)
MCHC: 33.6 g/dL (ref 32.0–36.0)
MCV: 96 fL (ref 80–100)
Monocyte #: 0.5 x10 3/mm (ref 0.2–1.0)
Neutrophil %: 93.2 %
Platelet: 154 10*3/uL (ref 150–440)
RDW: 14.5 % (ref 11.5–14.5)

## 2012-03-21 LAB — BASIC METABOLIC PANEL
Anion Gap: 9 (ref 7–16)
BUN: 34 mg/dL — ABNORMAL HIGH (ref 7–18)
Calcium, Total: 8.2 mg/dL — ABNORMAL LOW (ref 8.5–10.1)
Chloride: 106 mmol/L (ref 98–107)
Co2: 23 mmol/L (ref 21–32)
EGFR (African American): 50 — ABNORMAL LOW
Glucose: 163 mg/dL — ABNORMAL HIGH (ref 65–99)

## 2012-03-21 LAB — CK: CK, Total: 932 U/L — ABNORMAL HIGH (ref 35–232)

## 2012-03-21 LAB — URINE CULTURE

## 2012-03-24 LAB — CULTURE, BLOOD (SINGLE)

## 2012-03-26 ENCOUNTER — Ambulatory Visit (INDEPENDENT_AMBULATORY_CARE_PROVIDER_SITE_OTHER): Payer: Medicare Other | Admitting: Family Medicine

## 2012-03-26 ENCOUNTER — Encounter: Payer: Self-pay | Admitting: Family Medicine

## 2012-03-26 VITALS — BP 122/86 | HR 62 | Temp 98.1°F | Ht 72.0 in

## 2012-03-26 DIAGNOSIS — R531 Weakness: Secondary | ICD-10-CM

## 2012-03-26 DIAGNOSIS — N453 Epididymo-orchitis: Secondary | ICD-10-CM

## 2012-03-26 DIAGNOSIS — N452 Orchitis: Secondary | ICD-10-CM | POA: Insufficient documentation

## 2012-03-26 DIAGNOSIS — E119 Type 2 diabetes mellitus without complications: Secondary | ICD-10-CM

## 2012-03-26 DIAGNOSIS — IMO0002 Reserved for concepts with insufficient information to code with codable children: Secondary | ICD-10-CM

## 2012-03-26 DIAGNOSIS — R5381 Other malaise: Secondary | ICD-10-CM

## 2012-03-26 NOTE — Progress Notes (Signed)
Subjective:    Patient ID: Jose Murray AGE, male    DOB: [birth year], 71 y.o.   MRN: [id number]  HPI Here for hosp f/u and also face to face eval for home health care  Was hosp for orchitis - resulting in SIRS/ sepsis Saw Dr Achilles Dunk  Was on lot of antibiotics - and now is still on cipro -- for 14 day course Thinks the infection is getting better  Sees Dr Achilles Dunk next Wednesday   Is sleeping some better- the Remus Loffler helps when he needs it (tries not to use it every single day) Still same amount of pain - from nerve damage  lyrica did not help   For home health nursing  Needs PT and OT  Deconditioned  His home health referral should have been done from the hospital 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 DIABETES MELLITUS, TYPE II  . HYPERLIPIDEMIA  . RBB W/ LFAB  . Atrial fibrillation  . ATRIAL FLUTTER  . SYSTOLIC HEART FAILURE, CHRONIC  . CAROTID OCCLUSIVE DISEASE  . COPD  . URINARY TRACT INFECTION, CHRONIC  . URINARY RETENTION  . MICROALBUMINURIA  . SPINAL CORD INJURY  . TOBACCO USE, QUIT  . Hyperkalemia  . Chronic shoulder pain  . Blurry vision, right eye  . Pedal edema  . Fall  . Fever and chills  . Anemia  . UTI (lower urinary tract infection)  . Neck pain  . Pneumonia due to Klebsiella pneumoniae  . Right knee pain  . Weakness generalized  . Preop cardiovascular exam  . Neuropathy  . Orchitis   Past Medical History  Diagnosis Date  . Atrial fibrillation   . COPD (chronic obstructive pulmonary disease)   . Diabetes mellitus   . Hyperlipidemia   . Spinal cord injury     wheelchair bound  . Neurogenic bladder     occ infections  . Carotid  stenosis   . Chronic UTI    Past Surgical History  Procedure Date  . Eye surgery     cataract extraction  . Orchiectomy     left, secondary to torsion  . X-stop implantation 1998    decompression, spinal surgery 04/2003  . Cervical spine surgery     bone graft  . Cardiac catheterization 1992  . Cervical discectomy [id number]  . Spinal cord decompression     C4  . Back surgery   . Abdominal aortic aneurysm repair    History  Substance Use Topics  . Smoking status: Former Smoker    Quit date: 04/16/2006  . Smokeless tobacco: Never Used  . Alcohol Use: No   Family History  Problem Relation Age of Onset  . Stroke Mother   . Alzheimer's disease Mother    Allergies  Allergen Reactions  . Quinidine    Current Outpatient Prescriptions on File Prior to Visit  Medication Sig Dispense Refill  . amiodarone (PACERONE) 200 MG tablet Take 1 tablet (200 mg total) by mouth daily.  30 tablet  3  . dabigatran (PRADAXA) 150 MG CAPS Take 1 capsule (150 mg total) by mouth 2 (two) times daily.  60 capsule  3  . dutasteride (AVODART) 0.5 MG capsule Take 0.5 mg by mouth every other day.       . furosemide (LASIX) 40 MG tablet Take 1 tablet (40 mg total) by mouth 2 (two) times daily as needed.  180 tablet  3  . glipiZIDE (GLUCOTROL) 10 MG tablet Take 1 tablet (10 mg total) by mouth daily.  90 tablet  3  . HYDROcodone-acetaminophen (NORCO/VICODIN) 5-325 MG per tablet as needed.      . insulin glargine (LANTUS) 100 UNIT/ML injection Inject 20-30 Units into the skin at bedtime.      . metFORMIN (GLUCOPHAGE) 500 MG tablet Take 1 1/2 tablets by mouth daily  270 tablet  3  . pravastatin (PRAVACHOL) 20 MG tablet Take 1 tablet (20 mg total) by mouth daily.  90 tablet  3  . pregabalin (LYRICA) 150 MG capsule Take 1 capsule (150 mg total) by mouth 2 (two) times daily.  60 capsule  3  . Tamsulosin HCl (FLOMAX) 0.4 MG CAPS Take by mouth daily.        Marland Kitchen zolpidem (AMBIEN) 10 MG tablet Take 0.5-1 tablets  (5-10 mg total) by mouth at bedtime as needed for sleep.  30 tablet  0        Review of Systems Review of Systems  Constitutional: Negative for fever, appetite change, and unexpected weight change. pos for generalized fatigue and weakness  Eyes: Negative for pain and visual disturbance.  Respiratory: Negative for cough and shortness of breath. (has baseline sob from copd)  Cardiovascular: Negative for cp or palpitations    Gastrointestinal: Negative for nausea, diarrhea and constipation.  Genitourinary: Negative for urgency and frequency.  Skin: Negative for pallor or rash   MSK pos for baseline weakness/numbness of legs , pos for increased weakness of arms and shoulder girdle since hospitalization  Neurological: Negative for  light-headedness, and headaches.  Hematological: Negative for adenopathy. Does not bruise/bleed easily.  Psychiatric/Behavioral: Negative for dysphoric mood. The patient is not nervous/anxious.  pos for problems with sleep       Objective:   Physical Exam  Constitutional: He appears well-developed and well-nourished. No distress.       In wheelchair/not distressed   HENT:  Head: Normocephalic and atraumatic.  Mouth/Throat: Oropharynx is clear and moist.  Eyes: Conjunctivae normal and EOM are normal. Pupils are equal, round, and reactive to light. Right eye exhibits no discharge. Left eye exhibits no discharge. No scleral icterus.  Neck: Normal range of motion. Neck supple. No JVD present. Carotid bruit is not present. No thyromegaly present.  Cardiovascular: Normal rate, regular rhythm, normal heart sounds and intact distal pulses.  Exam reveals no gallop.   Pulmonary/Chest: Effort normal and breath sounds normal. No respiratory distress. He has no wheezes.  Abdominal: Soft. Bowel sounds are normal. He exhibits no distension, no abdominal bruit and no mass. There is no tenderness.  Musculoskeletal: He exhibits edema. He exhibits no tenderness.       Baseline  pedal edema with support hose   Lymphadenopathy:    He has no cervical adenopathy.  Neurological: He is alert. He has normal reflexes. No cranial nerve deficit. He exhibits abnormal muscle tone.       Baseline weak and numb legs with inability to move them  Some increased tone in arms and hands/ with increased weakness from baseline , also shoulder shrug   Skin: Skin is warm and dry. No rash noted. No erythema. No pallor.  Psychiatric:  He has a normal mood and affect.          Assessment & Plan:

## 2012-03-26 NOTE — Patient Instructions (Addendum)
Cancel appt for December 21st and re schedule for late February  If sugar does not start to come down let me know  See Dr Achilles Dunk as planned  We will do referral for home health at check out

## 2012-03-27 NOTE — Assessment & Plan Note (Signed)
With infection resolving expect improvement in DM control  Will watch diet and watch sugars closely Will plan a1c and f/u in feb

## 2012-03-27 NOTE — Assessment & Plan Note (Signed)
Acute on chronic in pt with paraplegia from spinal cord injury baseline  Has onset of inc upper body weakness due to deconditioning after hosp for sepsis  He is homebound/ wheel chair bound and would like to stay as independent as possible  Would like to help with transfers  Ref for home health PT at this time

## 2012-03-27 NOTE — Assessment & Plan Note (Signed)
S/p hosp for SIRS and sepsis with this - much improved and taking course of cipro  For urol f/u soon  Currently with foley cath- unsure if he will be transitioned back to self cath routing

## 2012-03-28 ENCOUNTER — Ambulatory Visit: Payer: Medicare Other | Admitting: Cardiovascular Disease

## 2012-04-02 ENCOUNTER — Telehealth: Payer: Self-pay

## 2012-04-02 NOTE — Telephone Encounter (Signed)
I will forward this to Terri and see what she thinks

## 2012-04-02 NOTE — Telephone Encounter (Signed)
pts daughter,Sharon who is an ER nurse and home health nurse said pt has lab appt at Dr Royden Purl office on 12/20 for renal profile and cbc c diff; since pt is paraplegic Jasmine December wants to know if she could draw blood and bring to [facility] on 04/04/12 between 7:30 and 8 am. Pt said from time she draws blood until at office would be approx 15 mins.Please advise.

## 2012-04-02 NOTE — Telephone Encounter (Signed)
Jose Murray pts daughter left v/m requesting call back re;pts lab appt. Left v/m for sharon to call back.

## 2012-04-03 NOTE — Telephone Encounter (Signed)
OK that's fine. Please let her know to bring current insurance info.

## 2012-04-03 NOTE — Telephone Encounter (Signed)
Left voicemail on daughter Sharon's phone letting her know that it is ok for her to draw blood and bring it up here

## 2012-04-04 ENCOUNTER — Other Ambulatory Visit (INDEPENDENT_AMBULATORY_CARE_PROVIDER_SITE_OTHER): Payer: Medicare Other

## 2012-04-04 ENCOUNTER — Ambulatory Visit: Payer: Self-pay | Admitting: Urology

## 2012-04-04 DIAGNOSIS — N289 Disorder of kidney and ureter, unspecified: Secondary | ICD-10-CM

## 2012-04-04 DIAGNOSIS — E875 Hyperkalemia: Secondary | ICD-10-CM

## 2012-04-04 DIAGNOSIS — D649 Anemia, unspecified: Secondary | ICD-10-CM

## 2012-04-04 LAB — RENAL FUNCTION PANEL
Albumin: 2.5 g/dL — ABNORMAL LOW (ref 3.5–5.2)
Creatinine, Ser: 1.3 mg/dL (ref 0.4–1.5)
Glucose, Bld: 164 mg/dL — ABNORMAL HIGH (ref 70–99)
Phosphorus: 4.4 mg/dL (ref 2.3–4.6)
Potassium: 4 mEq/L (ref 3.5–5.1)
Sodium: 138 mEq/L (ref 135–145)

## 2012-04-04 LAB — CBC WITH DIFFERENTIAL/PLATELET
Basophils Absolute: 0.1 10*3/uL (ref 0.0–0.1)
Basophils Relative: 0.6 % (ref 0.0–3.0)
Eosinophils Absolute: 0.1 10*3/uL (ref 0.0–0.7)
Lymphocytes Relative: 14 % (ref 12.0–46.0)
MCHC: 33.3 g/dL (ref 30.0–36.0)
Neutrophils Relative %: 75.3 % (ref 43.0–77.0)
Platelets: 461 10*3/uL — ABNORMAL HIGH (ref 150.0–400.0)
RBC: 3.39 Mil/uL — ABNORMAL LOW (ref 4.22–5.81)

## 2012-04-04 NOTE — Addendum Note (Signed)
Addended by: Alvina Chou on: 04/04/2012 09:57 AM   Modules accepted: Orders

## 2012-04-15 ENCOUNTER — Ambulatory Visit: Payer: Self-pay | Admitting: Urology

## 2012-04-17 ENCOUNTER — Telehealth: Payer: Self-pay

## 2012-04-17 NOTE — Telephone Encounter (Signed)
Gave verbal order to hold PT until after pt's surgery

## 2012-04-17 NOTE — Telephone Encounter (Signed)
Margaret PT with [facility] left v/m requesting to hold PT for patient until after surgery next week.Please advise.

## 2012-04-17 NOTE — Telephone Encounter (Signed)
Fine to verbally ok that order, thanks

## 2012-04-20 DIAGNOSIS — G808 Other cerebral palsy: Secondary | ICD-10-CM

## 2012-04-20 DIAGNOSIS — E1165 Type 2 diabetes mellitus with hyperglycemia: Secondary | ICD-10-CM

## 2012-04-20 DIAGNOSIS — N453 Epididymo-orchitis: Secondary | ICD-10-CM

## 2012-04-20 DIAGNOSIS — I129 Hypertensive chronic kidney disease with stage 1 through stage 4 chronic kidney disease, or unspecified chronic kidney disease: Secondary | ICD-10-CM

## 2012-04-22 ENCOUNTER — Ambulatory Visit: Payer: Self-pay | Admitting: Urology

## 2012-04-22 LAB — PROTIME-INR: INR: 1

## 2012-04-23 LAB — PATHOLOGY REPORT

## 2012-04-29 ENCOUNTER — Telehealth: Payer: Self-pay

## 2012-04-29 NOTE — Telephone Encounter (Signed)
Spoke with PT and gave verbal order to restart PT, PT said they are going to restart PT next week, pt notified

## 2012-04-29 NOTE — Telephone Encounter (Signed)
Can I verbally order re start of PT?- if so , give the ok , thanks

## 2012-04-29 NOTE — Telephone Encounter (Signed)
Pt left v/m requesting call back about PT;PT was placed on hold due to pt having surgery. Pt spoke with Jose Murray PT with [facility] and Jose Murray said would need new orders from Dr Milinda Antis to restart PT. Jose Murray is requesting orders sent to [facility] so PT can restart;Please advise. Pt request call back when orders placed.

## 2012-04-30 ENCOUNTER — Telehealth: Payer: Self-pay

## 2012-04-30 NOTE — Telephone Encounter (Signed)
Sharon,pt's daughter left v/m stating pt having altered mental status when takes [place] with lyrica;Jasmine December wants to know if could change ambien to tranzodone to help pt sleep. Jasmine December also wanted to know if pt could take Gabapentin with Lyrica;still having neuropathy pain in hips. Lyrica is helping some but pt is still not sleeping at night. [facility].Jasmine December request call back.(Cannot find Jasmine December on [employer] release by pt).

## 2012-04-30 NOTE — Telephone Encounter (Signed)
Gabapentin and lyrica are interchangable - but not given together (they are in the same class)  The trazadone is a good thought- I looked it up and there can be some interaction with his pacerone- so I'm going to check with his cardiologist about that before I give the OK  Will get back to them soon Good questions I definitely do not want him to mix ambien with lyrica if he has that reaction

## 2012-04-30 NOTE — Telephone Encounter (Signed)
Left voicemail requesting call back, will try to call again tomorrow

## 2012-05-01 MED ORDER — TRAZODONE HCL 50 MG PO TABS: 25.0000 mg | ORAL_TABLET | Freq: Every evening | ORAL | Status: AC | PRN

## 2012-05-01 NOTE — Addendum Note (Signed)
Addended by: Roxy Manns A on: 05/01/2012 04:15 PM   Modules accepted: Orders

## 2012-05-01 NOTE — Telephone Encounter (Signed)
His cardiologist thought trazadone was ok to try I will sent it to [facility] and I hope it helps

## 2012-05-01 NOTE — Telephone Encounter (Signed)
Pt's daughter notified.

## 2012-05-02 ENCOUNTER — Other Ambulatory Visit: Payer: Self-pay | Admitting: *Deleted

## 2012-05-02 MED ORDER — DABIGATRAN ETEXILATE MESYLATE 150 MG PO CAPS: 150.0000 mg | ORAL_CAPSULE | Freq: Two times a day (BID) | ORAL | Status: AC

## 2012-05-02 NOTE — Telephone Encounter (Signed)
Refilled Pradaxa quantity#60 refill#3 sent to [facility].

## 2012-05-23 ENCOUNTER — Other Ambulatory Visit: Payer: Self-pay

## 2012-05-23 MED ORDER — METFORMIN HCL 500 MG PO TABS: ORAL_TABLET | ORAL | Status: AC

## 2012-05-23 NOTE — Telephone Encounter (Signed)
Will change in the chart  Sent to [facility] for 270 (3 mo supply) with 3 refils - if he just wants one month at a time we can change that thanks

## 2012-05-23 NOTE — Telephone Encounter (Signed)
Left voicemail letting pt know Rx was sent in with correct directions and a 3 month supply, but if needs a 1 month supply instead just call us back

## 2012-05-23 NOTE — Telephone Encounter (Signed)
Pt left v/m requesting 90 day refill for metformin; it appears in chart metformin was sent to [facility] for #270. Pt said he can only get # 45 so has to go twice a month to pharmacy since he takes 1 1/2 pills of metformin twice a day. Med list has 1 1/2 tabs daily. Unable to clarify with pt; no answer on pts phone. April at [facility] said pt has been picking up # 45 twice a month indicating he is taking 1 1/2 twice a day; April also said quantity # 270 would be 3 month refill taking med twice a day.Please advise.

## 2012-05-23 NOTE — Telephone Encounter (Signed)
Pt called back and said he has been taking Metformin 500 mg 1 1/2 tabs twice a day for years.Please advise.

## 2012-06-06 ENCOUNTER — Ambulatory Visit (INDEPENDENT_AMBULATORY_CARE_PROVIDER_SITE_OTHER): Payer: Medicare Other | Admitting: Family Medicine

## 2012-06-06 ENCOUNTER — Encounter: Payer: Self-pay | Admitting: Family Medicine

## 2012-06-06 VITALS — BP 108/64 | HR 75 | Temp 98.4°F | Ht 72.0 in

## 2012-06-06 DIAGNOSIS — R5383 Other fatigue: Secondary | ICD-10-CM

## 2012-06-06 DIAGNOSIS — G609 Hereditary and idiopathic neuropathy, unspecified: Secondary | ICD-10-CM

## 2012-06-06 DIAGNOSIS — G629 Polyneuropathy, unspecified: Secondary | ICD-10-CM

## 2012-06-06 DIAGNOSIS — R531 Weakness: Secondary | ICD-10-CM

## 2012-06-06 DIAGNOSIS — D649 Anemia, unspecified: Secondary | ICD-10-CM

## 2012-06-06 DIAGNOSIS — E785 Hyperlipidemia, unspecified: Secondary | ICD-10-CM

## 2012-06-06 DIAGNOSIS — E119 Type 2 diabetes mellitus without complications: Secondary | ICD-10-CM

## 2012-06-06 DIAGNOSIS — G589 Mononeuropathy, unspecified: Secondary | ICD-10-CM

## 2012-06-06 LAB — CBC WITH DIFFERENTIAL/PLATELET
Hemoglobin: 11 g/dL — ABNORMAL LOW (ref 13.0–17.0)
Lymphocytes Relative: 22 % (ref 12–46)
Lymphs Abs: 1.4 10*3/uL (ref 0.7–4.0)
MCH: 30.1 pg (ref 26.0–34.0)
Monocytes Relative: 11 % (ref 3–12)
Neutro Abs: 4.2 10*3/uL (ref 1.7–7.7)
Neutrophils Relative %: 64 % (ref 43–77)
Platelets: 309 10*3/uL (ref 150–400)
RBC: 3.65 MIL/uL — ABNORMAL LOW (ref 4.22–5.81)
WBC: 6.5 10*3/uL (ref 4.0–10.5)

## 2012-06-06 NOTE — Patient Instructions (Addendum)
Hold your pravastatin for 1-2 weeks to see if this makes any difference in your weakness If not - call and let me know so I can refer you to a neurologist  Labs today  Keep working on strength the best you can

## 2012-06-06 NOTE — Progress Notes (Signed)
Subjective:    Patient ID: Jose Murray, male    DOB: 1940/06/26, 72 y.o.   MRN: [id number]  HPI Here for f/u of DM and other chronic dz incl anemia and also weakness/ deconditioning from last hosp  Had home PT  He still feels like he is getting weaker and weaker - tried really hard ( was working with bands) Just weak all over  Transfers are becoming more and more difficult  ?if pravastatin plays a role - he has been on it a long time but aware it can have muscle side eff However his symptoms have really become worse since last hosp for sepsis  lyrica does not last as long as it should - takes 150 twice daily It does help neuropathy symptoms (better than the neurontin) He does not think it gives him side effects   Urology issues- all is stable for the most part = has cath and sees his urologist very frequently  Diabetes- sugars are starting to come back to normal - after the infection  Doing pretty good with his diet (occ low sugars)- not often/ only if he skips on eating  He bases his lantus dose based on his sugar  Due a1c today and thinks it will be better   insomina- started trazadone to help sleep It helps some (helps him stay asleep once he goes to sleep)  Remus Loffler made him "crazy" -not taking that any more   Anemia - suspect due to chronic dz Lab Results  Component Value Date   WBC 9.0 04/04/2012   HGB 10.7* 04/04/2012   HCT 32.0* 04/04/2012   MCV 94.2 04/04/2012   PLT 461.0* 04/04/2012      Chemistry      Component Value Date/Time   NA 138 04/04/2012 0846   K 4.0 04/04/2012 0846   CL 100 04/04/2012 0846   CO2 27 04/04/2012 0846   BUN 29* 04/04/2012 0846   CREATININE 1.3 04/04/2012 0846   CREATININE 1.10 03/07/2012 1805      Component Value Date/Time   CALCIUM 8.6 04/04/2012 0846   ALKPHOS 56 03/07/2012 1805   AST 13 03/07/2012 1805   ALT 12 03/07/2012 1805   BILITOT 0.3 03/07/2012 1805        Review of Systems Review of Systems  Constitutional:  Negative for fever, appetite change, fatigue and unexpected weight change.  Eyes: Negative for pain and visual disturbance.  Respiratory: Negative for cough and shortness of breath.   Cardiovascular: Negative for cp or palpitations    Gastrointestinal: Negative for nausea, diarrhea and constipation.  Genitourinary: Negative for urgency and frequency.  Skin: Negative for pallor or rash   Neurological: Negative for , light-headedness and  headaches. pos for diffuse generalized weakness , in addition to his baseline paraplegia  Hematological: Negative for adenopathy. Does not bruise/bleed easily.  Psychiatric/Behavioral: Negative for dysphoric mood. The patient is somewhat anxious at times but that has improved         Objective:   Physical Exam  Constitutional: He is oriented to person, place, and time. He appears well-developed and well-nourished. No distress.  HENT:  Head: Normocephalic and atraumatic.  Mouth/Throat: Oropharynx is clear and moist.  Eyes: Conjunctivae and EOM are normal. Pupils are equal, round, and reactive to light. Right eye exhibits no discharge. Left eye exhibits no discharge.  Neck: Normal range of motion. Neck supple. No JVD present. Carotid bruit is not present. No thyromegaly present.  Cardiovascular: Normal rate, regular rhythm, normal heart  sounds and intact distal pulses.  Exam reveals no gallop.   Pulmonary/Chest: Effort normal and breath sounds normal. No respiratory distress. He has no wheezes. He has no rales.  Diffusely distant bs -baseline  Abdominal: Soft. Bowel sounds are normal. He exhibits no distension, no abdominal bruit and no mass. There is no tenderness.  Genitourinary:  Has cath  Musculoskeletal: He exhibits edema. He exhibits no tenderness.  Baseline LE edema- wearing support stockings that help  Lymphadenopathy:    He has no cervical adenopathy.  Neurological: He is alert and oriented to person, place, and time. He displays no tremor. A  sensory deficit is present. No cranial nerve deficit. He exhibits abnormal muscle tone.  Baseline paraplegia - with strength and sensory deficits in LEs with abn DTRs Upper ext- generalized weakness in arms/ hands/ shoulders moreso than usual- with difficulty closing fists worse on the R side  No facial droop or apparent new neck weakness   With support hose today a diabetic foot exam was not performed  Skin: Skin is warm and dry. No rash noted. No erythema. No pallor.  Psychiatric: He has a normal mood and affect.  Affect is brighter than last visit- pt seems more motivated and energetic          Assessment & Plan:

## 2012-06-07 LAB — COMPREHENSIVE METABOLIC PANEL
ALT: 8 U/L (ref 0–53)
Albumin: 3.6 g/dL (ref 3.5–5.2)
CO2: 24 mEq/L (ref 19–32)
Chloride: 104 mEq/L (ref 96–112)
Glucose, Bld: 197 mg/dL — ABNORMAL HIGH (ref 70–99)
Potassium: 4.4 mEq/L (ref 3.5–5.3)
Sodium: 144 mEq/L (ref 135–145)
Total Protein: 6.2 g/dL (ref 6.0–8.3)

## 2012-06-08 NOTE — Assessment & Plan Note (Signed)
Re check this today Suspect due to chronic dz  Watching for renal insuff also

## 2012-06-08 NOTE — Assessment & Plan Note (Signed)
Pt takes pravachol - unlikely source of muscle weakness but poss- so will hold that med 1-2 wk and report back

## 2012-06-08 NOTE — Assessment & Plan Note (Signed)
Much moreso since his hosp for sepsis - and no subjective improvement per pt since his home PT Will hold pravachol on the off chance this is the cause (doubtful)  If not improved-will refer to general neurology for eval  (note : this is in addition to his baseline paraplegia from remote spinal injury)

## 2012-06-08 NOTE — Assessment & Plan Note (Signed)
Overall pt indicates more improvement with lyrica than gabapentin  Still bothersome, however

## 2012-06-12 ENCOUNTER — Other Ambulatory Visit: Payer: Self-pay | Admitting: *Deleted

## 2012-06-12 DIAGNOSIS — R609 Edema, unspecified: Secondary | ICD-10-CM

## 2012-06-12 MED ORDER — FUROSEMIDE 40 MG PO TABS: 40.0000 mg | ORAL_TABLET | Freq: Two times a day (BID) | ORAL | Status: AC | PRN

## 2012-06-12 NOTE — Telephone Encounter (Signed)
Refilled Furosemide sent to [facility].

## 2012-06-24 ENCOUNTER — Other Ambulatory Visit: Payer: Self-pay | Admitting: *Deleted

## 2012-06-24 MED ORDER — AMIODARONE HCL 200 MG PO TABS: 200.0000 mg | ORAL_TABLET | Freq: Every day | ORAL | Status: AC

## 2012-06-24 NOTE — Telephone Encounter (Signed)
Refilled Amiodarone #30 Refill#3 sent to [facility].

## 2012-07-01 ENCOUNTER — Telehealth: Payer: Self-pay

## 2012-07-01 DIAGNOSIS — G629 Polyneuropathy, unspecified: Secondary | ICD-10-CM

## 2012-07-01 DIAGNOSIS — R531 Weakness: Secondary | ICD-10-CM

## 2012-07-01 DIAGNOSIS — IMO0002 Reserved for concepts with insufficient information to code with codable children: Secondary | ICD-10-CM

## 2012-07-01 NOTE — Telephone Encounter (Signed)
Britt Boozer pt's daughter request referral to Dr Arman Filter in [place] for profound weakness.Please advise.

## 2012-07-01 NOTE — Telephone Encounter (Signed)
Pt's daughter thinks that a lot of his pain is coming from his spine because he is having a lot of the same problems that he had in the past before he had surgery, so pt's daughter would like to go back to Dr. Jeral Fruit unless you really feel like he needs to go to a general neurologist, please advise

## 2012-07-01 NOTE — Telephone Encounter (Signed)
Daughter notified and advise Shirlee Limerick will call to set up appt

## 2012-07-01 NOTE — Telephone Encounter (Signed)
That is fine- we will go with Dr Jeral Fruit- I will do the referral

## 2012-07-01 NOTE — Telephone Encounter (Signed)
Dr Jeral Fruit is a neurosurgeon - for instance dealing with disc problems and spinal cord injuries (as they know)- I think he has seen Dr Jeral Fruit in the past?  At his visit I had thought about sending him to a general neurologist instead since his weakness seems to be all over instead of just confined to the spinal cord injury area  Let me know what they think and we will refer

## 2012-07-03 NOTE — Addendum Note (Signed)
Addended by: Roxy Manns A on: 07/03/2012 12:21 PM   Modules accepted: Orders

## 2012-07-07 ENCOUNTER — Other Ambulatory Visit: Payer: Self-pay | Admitting: Family Medicine

## 2012-07-07 NOTE — Telephone Encounter (Signed)
Px written for call in   

## 2012-07-07 NOTE — Telephone Encounter (Signed)
Ok to refill 

## 2012-07-07 NOTE — Telephone Encounter (Signed)
Rx printed by mistake but I called in Rx as prescribed to pharmacy

## 2012-07-09 ENCOUNTER — Telehealth: Payer: Self-pay | Admitting: Family Medicine

## 2012-07-09 NOTE — Telephone Encounter (Signed)
Caller: Sharon/Child; Phone: [phone]; Reason for Call: Caller is wanting to follow up with office regarding referral for pt.  Caller thought she would hear about an appt to [facility] by Monday, 11/06/12 but she has not heard anything yet.  OFFICE PLEASE FOLLOW UP WITH CALLER REGARDING REFERRAL

## 2012-07-09 NOTE — Telephone Encounter (Signed)
I will route to [facility] re: status of the referral

## 2012-07-10 NOTE — Telephone Encounter (Signed)
Thank you :)

## 2012-07-10 NOTE — Telephone Encounter (Signed)
Jose Murray has an appt with [facility] Dr Lesia Sago on 07/25/2012 and is aware of this appt. [facility] is now on [facility] and also part of [facility].

## 2012-07-14 ENCOUNTER — Telehealth: Payer: Self-pay | Admitting: Family Medicine

## 2012-07-14 NOTE — Telephone Encounter (Signed)
Confidential Office Message [address] [place], [place] [address] p. [phone] f. [phone] To: Gar [facility]bon (After Hours Triage) Fax: [phone] From: [facility] Date/ Time: 08-09-12 7:32 PM Taken By: Mercer Pod, CSR Caller: John Facility: [facility] Patient: Jose Murray, Jose Murray DOB: Jul 15, 1940 Phone: [phone] Reason for Call: Report a Death patient passed away at home time of death 7:12 pm 09-Aug-2012. They are sending the death certificate for Dr. Milinda Antis to sign on Monday

## 2012-07-14 NOTE — Telephone Encounter (Signed)
Spoke to family member today- pt was found deceased this weekend slumped over in his chair- they suspect he was reaching for a razor on a table when it happened and suspect his "heart gave out" I extended my sympathies and noted their great care of him throughout his life of complex medical problems (the family worked very well together) Will sign the death certificate when it arrives

## 2012-07-14 NOTE — Telephone Encounter (Signed)
I canceled all of pt's future appt with [place] and changed his status to deceased

## 2012-07-15 DEATH — deceased

## 2012-07-25 ENCOUNTER — Ambulatory Visit: Payer: Medicare PPO | Admitting: Neurology

## 2012-07-25 ENCOUNTER — Institutional Professional Consult (permissible substitution): Payer: Medicare PPO | Admitting: Neurology

## 2012-08-22 ENCOUNTER — Other Ambulatory Visit: Payer: Medicare Other

## 2012-08-29 ENCOUNTER — Ambulatory Visit: Payer: Medicare Other | Admitting: Family Medicine

## 2013-01-23 DIAGNOSIS — IMO0001 Reserved for inherently not codable concepts without codable children: Secondary | ICD-10-CM

## 2013-01-23 DIAGNOSIS — G808 Other cerebral palsy: Secondary | ICD-10-CM

## 2013-01-23 DIAGNOSIS — I129 Hypertensive chronic kidney disease with stage 1 through stage 4 chronic kidney disease, or unspecified chronic kidney disease: Secondary | ICD-10-CM

## 2013-01-23 DIAGNOSIS — E1165 Type 2 diabetes mellitus with hyperglycemia: Secondary | ICD-10-CM

## 2014-08-03 NOTE — Discharge Summary (Signed)
PATIENT NAME:  Jose Murray, Jose Murray MR#:  [id number] DATE OF BIRTH:  Sep 23, 1940  DATE OF ADMISSION:  03/19/2012 DATE OF DISCHARGE:  03/21/2012  ADMITTING DIAGNOSIS: Urosepsis.   DISCHARGE DIAGNOSES: 1. Systemic inflammatory response reaction.  2. Right epididymoorchitis.  3. Questionable urinary tract infection. Urine culture showed pseudomonas aeruginosa.  4. Right testicular swelling and pain. 5. Hypertension due to systemic inflammatory response syndrome, resolved.  6. Acute renal failure, resolving on intravenous fluids.  7. 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  8. Atrial fibrillation, on Pradaxa.  9. Diabetes mellitus type 2 insulin dependent.  10. Hypertension.  11. Hyperlipidemia.  12. Benign prostatic hypertrophy and urinary retention in the past.  13. Congestive heart failure, chronic, combined systolic and diastolic.  14. Tobacco abuse.   DISCHARGE CONDITION: Stable.   DISCHARGE MEDICATIONS: Patient is to continue:  1. Amiodarone 200 mg p.o. daily.  2. Pradaxa 150 mg p.o. twice daily. 3. Avodart 0.5 mg p.o. every other day. 4. Flomax 0.4 mg p.o. once daily.  5. Pravastatin 10 mg p.o. at bedtime.  6. Glipizide 10 mg p.o. daily.  7. Lantus 20 to 30 units subcutaneously daily at bedtime.  8. Lyrica 150 mg p.o. twice daily.  9. Ciprofloxacin 500 mg p.o. twice daily for 14 days.  10. Norco 325/5 mg 1 tablet every six hours as needed.  11. Patient is to stop Glucophage, Lasix and metformin until recommended by primary care physician.   TREATMENT: In and out bladder catheterization every 6 to 8 hours as needed.  HOME OXYGEN: None.   DIET: 2 grams salt, low fat, low cholesterol, carbohydrate controlled diet, regular consistency.   ACTIVITY LIMITATIONS: As tolerated. Patient was advised to drink plenty of fluids.   REFERRAL: Physical therapy 2 to 7 times a week to be performed by caregiver.    FOLLOW UP: Follow-up appointment with Dr.  Milinda Antis in two days after discharge as well as Dr. Lonna Cobb in one week after discharge.   CONSULTANT: Dr. Lonna Cobb.   HISTORY OF PRESENT ILLNESS: Patient is a 74 year old Caucasian male with past medical history significant for history of quadriplegia who requires period bladder catheterization presented to the hospital with complaints of testicular swelling as well as fever. Please refer to Dr. Larose Hires admission note on 03/19/2012. On arrival to Emergency Room patient's vital signs: Temperature 102.1, pulse 120, blood pressure 109/67, later dropped down to 87/50. Patient's physical examination revealed right testicular enlargement and swelling and pain.   LABORATORY, DIAGNOSTIC AND RADIOLOGICAL DATA: Chest x-ray, PA and lateral 03/19/2012 revealed appearance of pulmonary interstitium suggesting edema which may be of cardiac or noncardiac cause. There is no focal pneumonia or pleural effusion. Correlation with clinical and laboratory values were needed. In appropriate clinical setting low grade congestive heart failure could be present. Ultrasound of testicle 03/19/2012 revealed no evidence of testicular torsion or intratesticular mass. The epididymal structures are normal in appearance. There is small right-sided hydrocele. 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   Lab data showed elevation of BUN and creatinine to 29 and 1.38, glucose was high at 201, otherwise BMP was unremarkable. Patient's estimated GFR for African American would be 51. Liver enzymes were normal. White blood cell count was elevated to 12.4, hemoglobin 12.3, platelet count 213. Blood cultures x2 taken on 03/19/2012 showed no growth. Urine culture revealed more than 100,000 units of pseudomonas aeruginosa sensitive to all antibiotics including ciprofloxacin, gentamicin, ceftazidime, imipenem as well as levofloxacin. Patient's urinalysis revealed yellow cloudy urine, negative for glucose,  bilirubin or ketones, specific gravity 1.012, pH  5.0, 3+ blood, negative for protein and nitrites, 3+ leukocytes esterase, 96 red blood cells, and 499 white blood cells were noted as well as 1+ bacteria, and white blood cell clumps, mucus as well as budding yeast, less than 1 epithelial cell was noted in urine.  HOSPITAL COURSE: Patient was admitted to the hospital, started on broad-spectrum antibiotics. He was consulted by Dr. Lonna CobbStoioff. Dr. Lonna CobbStoioff saw patient on the same day, 03/19/2012. He felt the patient had right epididymal orchitis. He recommended no surgical intervention and continue antibiotics IV and follow cultures. Patient was initially started on Rocephin, however, patient's urine cultures came back positive for pseudomonas and he was switched to Zosyn. He was also hypotensive requiring high rate IV fluid administration.  In regards to systemic inflammatory response reaction, hypotension, as mentioned above, initially patient was hypotensive requiring IV fluid resuscitation. During hypotension episode patient developed acute renal failure. Patient creatinine went up to 1.91 on 03/20/2012, however, with IV fluids improved to 1.58 on 03/21/2012. In regards to hypotension itself, patient's hypotension resolved on IV fluid administration, as mentioned above. Patient's vital signs on day of discharge: Temperature 99.6, pulse 80, respiration rate 20, blood pressure 114/62, saturation was 94% to 96% on room air at rest. Patient was advised to continue p.o. fluid administration at high rate and follow up with his primary care physician, Dr. Roxy MannsMarne Tower.   In regards to right epididymal orchitis, patient's urine cultures pseudomonas. Patient is to continue ciprofloxacin at high doses.   In regards to cervical quadriplegia, atrial fibrillation, diabetes mellitus, hypertension, hyperlipidemia, benign prostatic hypertrophy as well as congestive heart failure, patient is to continue his usual outpatient medications and follow up for further recommendations.    Patient is being discharged per his request and he is to follow up with his primary care physician for further recommendations. His condition the day of discharge she is stable, however, is fragile and patient needs to be followed as outpatient very closely.   It is recommended to follow patient's kidney function upon discharge.   In regards to leukocytosis, as mentioned above patient's white blood cell count was elevated whenever he came in to 12,000, it worsened, however, on 03/20/2012 to 22.9 thousand due to inappropriate antibiotics, however, patient's white blood cell count improved when patient was switched to Zosyn. His white blood cell count was 14.4. It is recommended to follow patient's white blood cell count as well as kidney function with appropriate antibiotic use. Patient was also noted to be anemic with hemoglobin level dropping down to 9.9 on 03/21/2012. No bleeding was noted, however, it was felt that patient's anemia was very likely dehydration related. It is recommended to follow patient's anemia as outpatient as well as kidney function tests. Patient was advised not to take metformin because of his renal insufficiency. Patient's CK total was checked on 03/21/2012 and was found to be somewhat elevated at 932, again mild rhabdomyolysis very likely due to infection was of concern and patient was advised to continue high rate p.o. fluid intake. Patient is being discharged in relatively stable condition with above-mentioned medications and follow up with his primary care physician.   TIME SPENT: 40 minutes.   ____________________________ Katharina Caperima Dandra Velardi, MD rv:cms D: 03/22/2012 12:18:13 ET T: 03/22/2012 13:31:15 ET JOB#: [id number]  cc: Katharina Caperima Ediberto Sens, MD, <Dictator> Marne A. Tower, MD Lorin PicketScott C. Lonna CobbStoioff, MD Katharina CaperIMA Shaniqwa Horsman MD ELECTRONICALLY SIGNED 03/29/2012 19:43

## 2014-08-03 NOTE — Op Note (Signed)
PATIENT NAME:  Jose Murray, Jose W MR#:  [id number] DATE OF BIRTH:  February 09, 1941  DATE OF PROCEDURE:  02/12/2012  PREOPERATIVE DIAGNOSES:  1. Benign prostatic hypertrophy.  2. Urinary retention.   POSTOPERATIVE DIAGNOSES:  1. Benign prostatic hypertrophy.  2. Urinary retention.   PROCEDURE: KTP laser prostatectomy.   SURGEON: Madolyn FriezeBrian S. Achilles Dunkope, MD  ANESTHESIA: Laryngeal mask airway anesthesia.   INDICATIONS: The patient is a 74 year old gentleman with a history of benign prostatic hypertrophy and recurring urinary tract infections. He has been voiding spontaneously until recently. He developed urinary retention requiring intermittent catheterization. He also has paraplegia related to a previous spinal cord injury. On cystoscopy he was noted to have moderate bilobar prostatic hypertrophy. Given his ability to void spontaneously until recent we have elected to proceed with KTP laser prostatectomy to see if spontaneous voiding can be returned. He presents today for this purpose.   DESCRIPTION OF PROCEDURE: After informed consent was obtained, the patient was taken to the Operating Room and placed in the dorsal lithotomy position under laryngeal mask airway anesthesia. The patient was then prepped and draped in the usual standard fashion. The laser scope sheath was placed utilizing the visual obturator. Inflammatory changes were noted throughout the urethra consistent with Foley catheterization. Upon entering the prostatic fossa, prominent bilobar hypertrophy was noted with complete visual obstruction. The prostate length was noted to be relatively short. A high riding bladder neck was also noted. Upon entering the bladder, the mucosa was inspected in its entirety. Prominent trabeculation was noted throughout consistent with a high pressure bladder. The ureteral orifices were well visualized. They were noted to be more than adequate distance from the bladder neck. Some inflammatory changes noted on the posterior  bladder wall and bladder base consistent with Foley catheterization. No other gross mucosal lesions were appreciated. The KTP laser fiber was then inserted through the scope. Resection was begun at the level of the bladder neck back to the verumontanum. Additional lateral lobe tissue was taken down. An incision was made through the bladder neck to the level of the trigone. Additional lateral lobe tissue was taken down. Several large stones were encountered near the verumontanum. These were able to be pushed free utilizing the tip of the laser fiber. Multiple additional smaller stones were encountered. These were pushed into the urinary bladder. Some anterior tissue was also present. This was also taken down. The decision was made to leave a relative area of tissue at the level of the verumontanum to lessen the potential risk for urinary incontinence given his history. No significant bleeding was encountered. Upon completion an open prostate fossa was visualized. The ureteral orifices were re-visualized with no abnormalities noted. The resectoscope sheath was utilized to irrigate the stones free from the bladder. Revisualization demonstrated removal of all of the stones. The sheath was then removed. A 22 French Foley catheter was placed to gravity drainage without difficulty. The patient was returned to the supine position. He was awakened from laryngeal mask airway anesthesia. He was taken to the recovery room in stable condition. There were no problems or complications. The patient tolerated the procedure well.   ____________________________ Madolyn FriezeBrian S. Achilles Dunkope, MD bsc:cms D: 02/12/2012 19:29:34 ET T: 02/13/2012 08:47:24 ET JOB#: [id number]  cc: Madolyn FriezeBrian S. Achilles Dunkope, MD, <Dictator>  Madolyn FriezeBRIAN S Davanee Klinkner MD ELECTRONICALLY SIGNED 02/14/2012 8:17

## 2014-08-03 NOTE — Consult Note (Signed)
Chief Complaint:   Subjective/Chief Complaint pt sleeping.  daughter in law states some increased pain last night but better this am max temp 100   VITAL SIGNS/ANCILLARY NOTES: **Vital Signs.:   05-Dec-13 04:16   Vital Signs Type Routine   Temperature Temperature (F) 98.3   Celsius 36.8   Temperature Source Oral   Pulse Pulse 84   Respirations Respirations 18   Systolic BP Systolic BP 914   Diastolic BP (mmHg) Diastolic BP (mmHg) 61   Mean BP 77   Pulse Ox % Pulse Ox % 93   Pulse Ox Activity Level  At rest   Oxygen Delivery Room Air/ 21 %   Brief Assessment:   Additional Physical Exam right testis enlarged, indurated,less tender.   Lab Results: Routine Micro:  04-Dec-13 09:56    Organism 1 >100,000 CFU/ML PSEUDOMONAS AERUGINOSA  Routine Chem:  05-Dec-13 03:19    Glucose, Serum  288   BUN  36   Creatinine (comp)  1.91   Sodium, Serum  133   Potassium, Serum 4.8   Chloride, Serum 102   CO2, Serum 23   Calcium (Total), Serum  8.3   Anion Gap 8   Osmolality (calc) 285   eGFR (African American)  40   eGFR (Non-African American)  34 (eGFR values <56mL/min/1.73 m2 may be an indication of chronic kidney disease (CKD). Calculated eGFR is useful in patients with stable renal function. The eGFR calculation will not be reliable in acutely ill patients when serum creatinine is changing rapidly. It is not useful in  patients on dialysis. The eGFR calculation may not be applicable to patients at the low and high extremes of body sizes, pregnant women, and vegetarians.)  Routine Hem:  05-Dec-13 03:19    WBC (CBC)  22.9   RBC (CBC)  3.37   Hemoglobin (CBC)  11.0   Hematocrit (CBC)  32.6   Platelet Count (CBC) 179   MCV 97   MCH 32.7   MCHC 33.8   RDW 14.1   Neutrophil % 91.3   Lymphocyte % 2.8   Monocyte % 5.8   Eosinophil % 0.0   Basophil % 0.1   Neutrophil #  20.9   Lymphocyte #  0.6   Monocyte #  1.3   Eosinophil # 0.0   Basophil # 0.0 (Result(s) reported on 20 Mar 2012 at 04:27AM.)   Assessment/Plan:  Assessment/Plan:   Assessment right epididymo-orchitis. urine growing pseudomonas, sens pending    Plan cont iv antibiotics   Electronic Signatures: Abbie Sons (MD)  (Signed 05-Dec-13 10:20)  Authored: Chief Complaint, VITAL SIGNS/ANCILLARY NOTES, Brief Assessment, Lab Results, Assessment/Plan   Last Updated: 05-Dec-13 10:20 by Abbie Sons (MD)

## 2014-08-03 NOTE — Consult Note (Signed)
Brief Consult Note: Diagnosis: right epididymo-orchitis.   Patient was seen by consultant.   Consult note dictated.   Comments: no surgical intervention indicated. cont iv antibiotics. will follow.  Electronic Signatures: Riki AltesStoioff, Scott C (MD)  (Signed 04-Dec-13 18:59)  Authored: Brief Consult Note   Last Updated: 04-Dec-13 18:59 by Riki AltesStoioff, Scott C (MD)

## 2014-08-03 NOTE — Consult Note (Signed)
PATIENT NAME:  Jose Murray, Jose Murray MR#:  [id number] DATE OF BIRTH:  09-Aug-1940  DATE OF CONSULTATION:  03/19/2012  REFERRING PHYSICIAN:  Altamese DillingVaibhavkumar Vachhani, MD CONSULTING PHYSICIAN:  Marcques Wrightsman C. Lonna CobbStoioff, MD  REASON FOR CONSULTATION: Testicular swelling and fever.   HISTORY OF PRESENT ILLNESS: The patient is a 74 year old male with a history of spinal cord injury. Has followed with Dr. Achilles Dunkope for several years for recurrent urinary tract infections and incomplete bladder emptying. He developed urinary retention in the fall of 2013 and underwent photoselective vaporization of the prostate in late October 2013. He has had persistent urinary retention postoperative and was started on intermittent catheterization, which has been performed anywhere from two to four times daily. He has a prior history of left orchiectomy many years ago. Within the past 24 hours, he has developed right testicular pain, swelling, and fever to 103 degrees. He presented to the Emergency Department this morning and temperature was 102.3 degrees. He has been admitted by the hospitalist service and has been started on Unasyn and ceftriaxone.   PAST MEDICAL HISTORY:  1. Spinal cord injury.  2. Atrial fibrillation.  3. Diabetes mellitus type 2.  4. Hypertension. 5. Hyperlipidemia. 6. Benign prostatic hypertrophy.  7. Congestive heart failure.   PAST SURGICAL HISTORY:  1. Back surgery x2 for spinal cord injury.  2. Photoselective vaporization of the prostate.   MEDICATIONS ON ADMISSION:  1. Tamsulosin 0.4 mg daily. 2. Pravastatin 20 mg daily. 3. Pradaxa 150 mg twice a day. 4. Metformin 500 mg daily. 5. Lyrica 150 mg twice a day.  6. Lasix 40 mg twice a day. 7. Lantus insulin 20 to 30 units subcutaneous at bedtime.  8. Glipizide 10 mg daily.  9. Avodart 0.5 mg every other day. 10. Amiodarone 200 mg daily.  11. Acetaminophen p.r.n. pain.   SOCIAL HISTORY: The patient lives with family and has a caregiver.   ALLERGIES:  Quinidine.  REVIEW OF SYSTEMS: Unable to obtain an accurate review of systems secondary to lethargy and sedation.   PHYSICAL EXAMINATION:   VITAL SIGNS: Temperature 98, pulse 104, and blood pressure 99/64.   GENERAL: The patient is lethargic, somnolent.   HEENT: Moist mucous membranes.  NECK: Supple.   PULMONARY: Lungs are clear to auscultation.   CARDIOVASCULAR: Regular rate and rhythm.   ABDOMEN: Soft and nontender without masses.   GU: Foley catheter is draining clear urine. There is moderate scrotal edema and mild erythema. No necrosis or crepitus. No fluctuant areas. The right testis is enlarged and indurated. There is moderate tenderness present. Phallus is without lesions.   RECTAL: Prostate examination was deferred.   EXTREMITIES: Positive edema.  DATA: Electrolytes normal.  Creatinine 1.38. WBC 12.4 and hemoglobin and hematocrit 12.3 and 36.8.   Urinalysis: Yellow/cloudy, nitrite negative, 499 WBCs, 96 RBCs.  Scrotal sonogram was reviewed. There is no evidence of abscess. The right testis is enlarged.   IMPRESSION:  1. Right epididymoorchitis. 2. Chronic urinary retention.   RECOMMENDATIONS:  1. Continue IV antibiotics pending final ID sensitivity.  2. Keep scrotum elevated.  3. We will follow. ____________________________ Verna CzechScott C. Lonna CobbStoioff, MD scs:slb D: 03/19/2012 18:59:14 ET T: 03/20/2012 09:23:13 ET JOB#: [id number]  cc: Lorin PicketScott C. Lonna CobbStoioff, MD, <Dictator> Riki AltesSCOTT C Tarnisha Kachmar MD ELECTRONICALLY SIGNED 04/02/2012 9:51

## 2014-08-03 NOTE — H&P (Signed)
PATIENT NAME:  Jose Murray, Jose Murray MR#:  [id number] DATE OF BIRTH:  January 16, 1941  DATE OF ADMISSION:  03/19/2012  PRIMARY CARE PHYSICIAN: Roxy Manns, MD, [facility], [place]   PRIMARY UROLOGIST: Assunta Gambles, MD   PRESENTING COMPLAINT: Testicular swelling and fever.   HISTORY OF PRESENT ILLNESS: This is a 74 year old male with past medical history of back injury seven years ago and since then he is wheelchair bound and bedbound. He is dependent on all his activities. He has history of atrial fibrillation and CHF. Prostate surgery was done in October 2013. For the last four weeks he was advised to do in and out catheter daily and that is what he was doing. Since yesterday family noticed that his right testicle is getting swollen red. Today early morning he woke up with pain and fever so they brought him to the Emergency Room. In the ER they noticed he had left-sided orchiectomy done and that was due to infection in the past many years ago. Right-sided testicle is swollen. His blood pressure is on the lower side and he has fever of 102. Sonogram of the testes is done and does not show any torsion or epididymitis. He is given for admission for orchitis with the medical team.  REVIEW OF SYSTEMS: Positive for fever and mildly lethargy. Negative for any weakness, weight loss. Pain in the testis is present. The patient is mildly lethargic but easily arousable and answers questions. EYES: Denies any blurring, double vision, or discharge in the eyes. ENT: Denies any tinnitus, ear pain, or discharge from the ears. RESPIRATORY: Denies any cough, wheezing, hemoptysis. CARDIOVASCULAR: Denies chest pain, orthopnea, palpitations, or syncopal episode. GASTROINTESTINAL: Denies nausea, vomiting, diarrhea, abdominal pain, or change in bowel habits. GENITOURINARY: Denies dysuria. Has to use catheter in and out. Denies any hematuria. Has chronic incontinence of the urine because of spinal injury. SKIN: Denies any rashes or  lesions on the skin. MUSCULOSKELETAL: Denies any swelling or pain in the joints. NEUROLOGICAL: Bilateral lower limb weakness and having incontinence of the urine due to spinal injury. Denies any headache, tremors. PSYCHIATRIC: Denies anxiety, insomnia, or depression.  PAST MEDICAL HISTORY:  1. 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 2. Chronic atrial fibrillation, on Pradaxa. 3. Type II diabetes mellitus. 4. Hypertension. 5. Hyperlipidemia. 6. Benign prostatic hypertrophy. 7. Congestive heart failure, systolic and diastolic as per the previous echocardiogram result.   FAMILY HISTORY: Positive for hypertension.   SOCIAL HISTORY: He lives with his son and has 24/7 caregiver at home.   HOME MEDICATIONS:  1. Tamsulosin 0.4 mg oral daily.  2. Pravastatin 20 mg oral once a day.  3. Pradaxa 150 mg oral 2 times a day. 4. Metformin 500 mg once a day.  5. Lyrica 150 mg oral capsule 2 times a day. 6. Lasix 40 mg 2 times a day.  7. Lantus 20 to 30 units subcutaneous once a day at bedtime.   8. Glipizide 10 mg once a day. 9. Flomax 0.4 mg once a day. 10. Avodart 0.5 mg oral every other day. 11. Amiodarone 200 mg oral daily.  12. Acetaminophen as needed for pain.   PAST SURGICAL HISTORY:  1. Surgery x2 for spinal injury.  2. Prostate surgery last month.   PHYSICAL EXAMINATION:   VITAL SIGNS: On arrival to the ER, temperature 102.1, pulse rate 120, blood pressure 109/67 which later on blood pressure dropped to 87/54 and pulse rate dropped to 94.   GENERAL: The patient is  mildly lethargic but fully arousable and oriented on arousal.  HEENT: Conjunctivae pink. Oral mucosa moist.   NECK: Neck atraumatic. No JVD.   RESPIRATORY: Bilateral clear and equal air entry.   CARDIOVASCULAR: S1, S2 present, regular.   ABDOMEN: Soft, nontender. Bowel sounds present. No organomegaly appreciated.   JOINTS: No swelling or tenderness.   GENITOURINARY: Foley catheter  present. Right side testis enlarged up to a tennis ball size, redness present, soft, warm to feel. No gangrenous skin changes present. Swelling nonreducible on pressure. No hernia.  EXTREMITIES: Bilateral lower limbs cold and nonpitting edema present.   NEUROLOGICAL: Lower limbs power 0 out of 5; upper limb power 3 out of 5. No tremors or rigidity appreciated.   PSYCHIATRIC: Does not appear in any gross psychiatric illness.  LAB RESULTS: Glucose 201, BUN 29, creatinine 1.38, sodium 137, potassium 4.0, chloride 104, CO2 23, calcium 9.0, total protein 7.7, albumin 3.5, bilirubin 0.5, alkaline phosphate 89, SGOT 20, SGPT 20, WBC total 12.4, hemoglobin 12.3, platelet count 213, MCV 96. Urinalysis grossly positive with presence of 3+ leukocyte esterase and 499 WBCs.  Chest x-ray appearance of pulmonary interstitium suggest edema which may be of cardiac or noncardiac cause. There is no focal pneumonia or pleural effusion. Correlation with clinical and laboratory values.   Sonogram of testicle there is no evidence of testicular torsion nor of an intratesticular mass. Epididymal structures are normal in appearance. There is a small right-sided hydrocele. 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  Echocardiogram in September 2011 showed ejection fraction 45% with diastolic failure.   ASSESSMENT: This is a 74 year old male with multiple medical problems who presented after having prostate surgery last month and doing in and out urinary catheterization now came here with right testicular swelling, fever with hypotension and tachycardia.  1. Urosepsis/orchitis. I spoke to Dr. Achilles Dunk who is his urologist on the phone. He knows the patient very well and he agrees that due to repeated self catheterization there is high chance of getting colonization with bacteria in the bladder and prostate. Because prostate is connected with the testes for lymphatic drainage and the renal structures it is highly likely that he has  orchitis. He suggested to continue the patient on Rocephin which is already started and send blood culture and urine culture. One of his associates will follow the patient in the hospital. 2. This patient has tachycardia. His blood pressure is slowly dropping and he is tachycardic. He has all the signs of SIRS and sepsis. I will start him on IV fluids. He has history of CHF and low ejection fraction and chest x-ray shows mild congestion. Currently he is not in any symptomatic presentation of CHF. I would prefer not to give too much IV fluid or IV bolus for that. We will just watch and observe him for fluid overload or hypotension.  3. Diabetes mellitus. Insulin with sliding scale. No oral hypoglycemic medication now.  4. Atrial fibrillation. Will continue Pradaxa here. No carvedilol due to hypotension. No Lasix due to hypotension.  5. CHF, systolic versus diastolic as per old echocardiogram. Currently no physical signs of heart failure. His chronic lower limb swelling are due to his spinal injuries and immobilization. There are no crepitations heard on the chest examination.  6. Current smoker. Counseling was done for smoking cessation for five minutes. No need for nicotine patch as per him and his family.   CODE STATUS: DO NOT RESUSCITATE. Healthcare proxy is his son and his daughter. Daughter is present in the room.  She is a Engineer, civil (consulting)nurse in [employer] and she is an ex-nurse at [employer].   TOTAL TIME SPENT ON THIS ADMISSION: Critical care time was 50 minutes.   ____________________________ Hope PigeonVaibhavkumar G. Elisabeth PigeonVachhani, MD vgv:drc D: 03/19/2012 15:10:13 ET T: 03/19/2012 15:41:37 ET JOB#: [id number]  cc: Hope PigeonVaibhavkumar G. Elisabeth PigeonVachhani, MD, <Dictator> Marne A. Milinda Antisower, MD Madolyn FriezeBrian S. Achilles Dunkope, MD Altamese DillingVAIBHAVKUMAR Medina Degraffenreid MD ELECTRONICALLY SIGNED 03/31/2012 22:56

## 2014-08-06 NOTE — Op Note (Signed)
PATIENT NAME:  Catalina AntiguaCOUCH, Rushawn W MR#:  [id number] DATE OF BIRTH:  1940-11-30  DATE OF PROCEDURE:  04/22/2012  PREOPERATIVE DIAGNOSIS: Right loculated hydrocele.   POSTOPERATIVE DIAGNOSIS: Right loculated hydrocele.  PROCEDURE: Right hydrocelectomy.   SURGEON: Assunta GamblesBrian Adiah Guereca, M.D.   ANESTHESIA: Laryngeal mask airway anesthesia.   INDICATIONS: The patient is a 74 year old gentleman, who was recently hospitalized with acute right epididymal orchitis. He developed a reactive hydrocele, which has become loculated. He has had continued pain and discomfort. He presents for hydrocelectomy.   DESCRIPTION OF PROCEDURE: After informed consent was obtained, the patient was taken to the operating room and placed in the supine position on the operating table under laryngeal mask airway anesthesia. The patient was then prepped and draped in the usual standard fashion. A midline anterior scrotal incision was made approximately 5 cm in length. The incision was continued down to the right hydrocele. Extensive inflammation was encountered. A plane was subsequently identified allowing separation of the hydrocele sac from the surrounding scrotal tissue. Once the hydrocele and testicle was delivered from the site, a definitive hydrocele was noted anteriorly. A thick reaction was present of the surrounding tissue. The more proximal cord and other tissue was within relatively normal limits. The hydrocele was then opened on its anterior surface. Approximately 100 mL of clear yellow fluid was initially drained. Multiple loculations; however, were present within. These were separated utilizing digital manipulation. The hydrocele was adherent to the testicle over much of its surface. This was able to be separated free more medially than laterally to the approximate site of what should be the normal attachment areas. Due to the thickening of the tissue, it was not able to be invaginated posterior to the testicle. The decision was made  for as clean of excision as possible. A large portion of the tissue was excised utilizing electrocautery. Several areas of bleeding were encountered. These were cauterized utilizing electrocautery. The edges were then secured to the surrounding tissue utilizing interrupted 3-0 chromic sutures. The testicle was then returned to the hemiscrotal compartment. Due to the degree of inflammation, the decision was made to proceed with drain placement. A 10-French JP drain was inserted through a separate incision site in the inferior aspect of the scrotum. It was placed anterior to the testicle. The hemiscrotal compartment was closed in 2 layers. The first layer was a 3-0 interrupted chromic suture. The skin was then closed utilizing a 3-0 Monocryl suture utilizing a mattress stitch. Collodion was then applied over the incision site. Fluffs and a scrotal support were then placed. The patient was then awakened from laryngeal mask airway anesthesia. He was taken to the recovery room in stable condition. There were no problems or complications. The patient tolerated the procedure well.    ____________________________ Madolyn FriezeBrian S. Achilles Dunkope, MD bsc:aw D: 04/22/2012 10:42:47 ET T: 04/22/2012 14:00:08 ET JOB#: [id number]  cc: Madolyn FriezeBrian S. Achilles Dunkope, MD, <Dictator> Madolyn FriezeBRIAN S Zetha Kuhar MD ELECTRONICALLY SIGNED 04/22/2012 17:20

## 2014-11-01 IMAGING — US US PELVIS LIMITED
1 series · 14 of 25 positions shown · non-contrast
Comparison: none

REASON FOR EXAM: pain and swelling right testicle
COMMENTS:

[Series 1: us pelvis limited · 0.08mm/px · 14 of 48 slices shown]
[im 1/48]
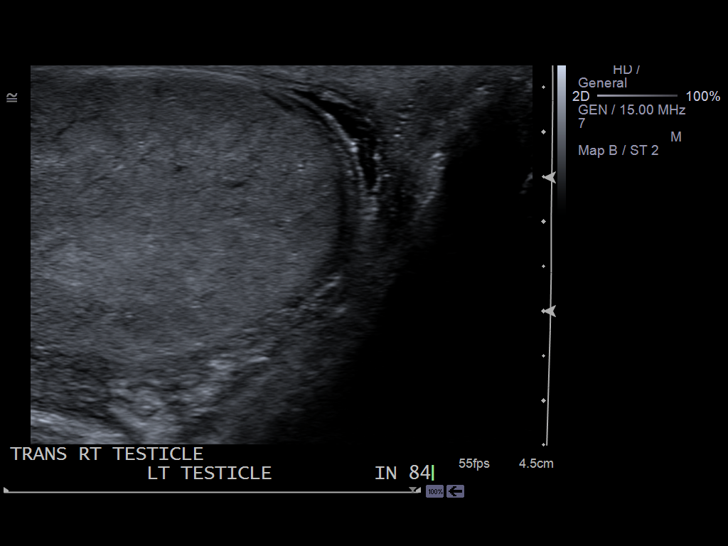
[im 4/48]
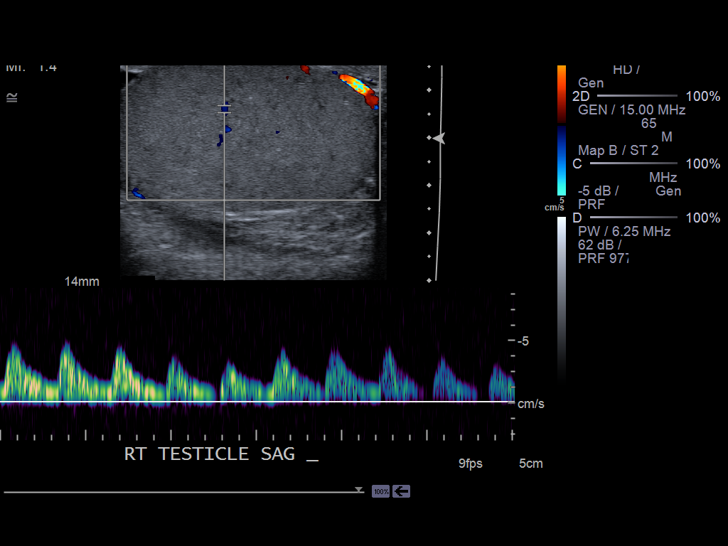
[im 8/48]
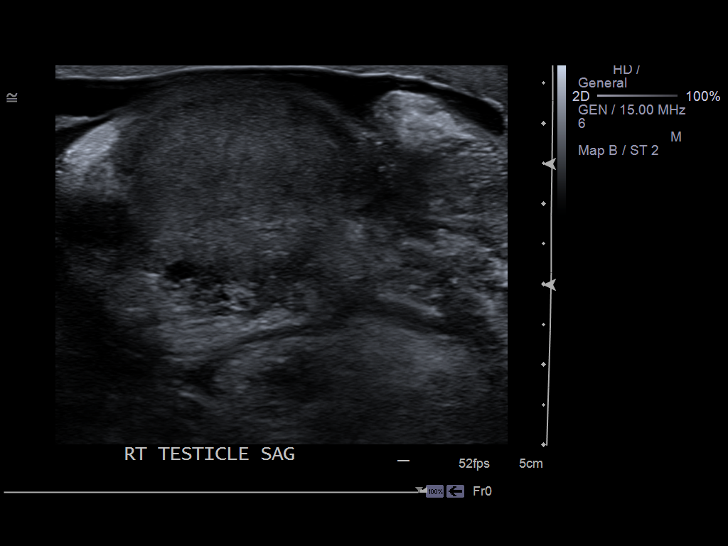
[im 12/48]
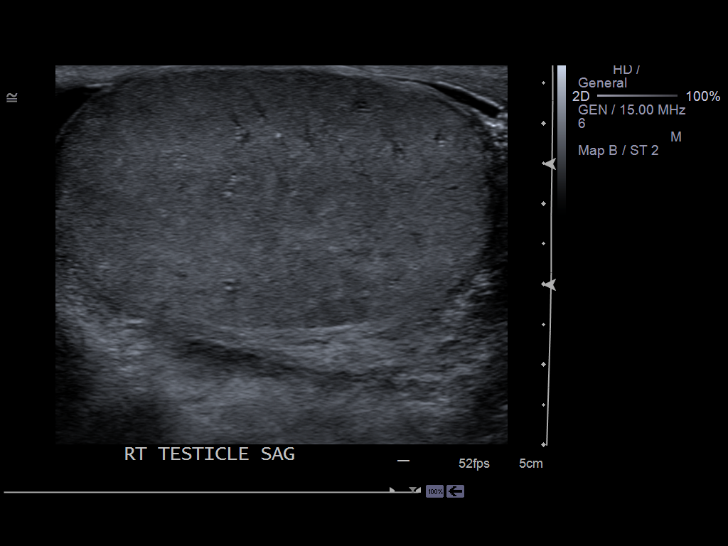
[im 16/48]
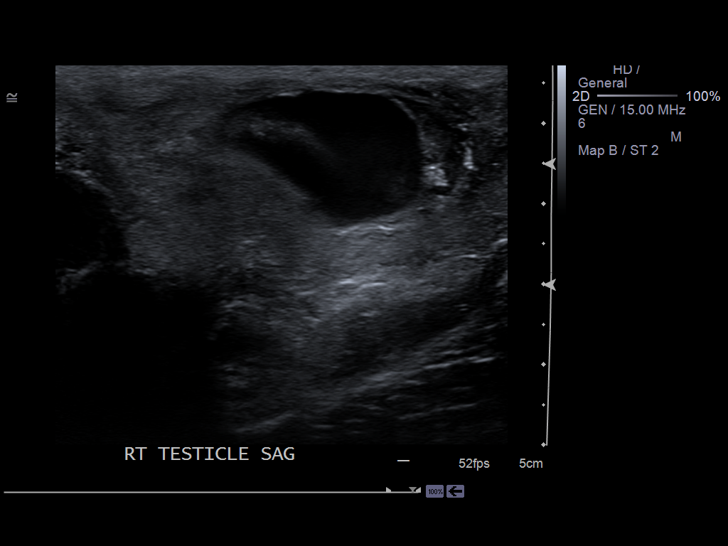
[im 18/48]
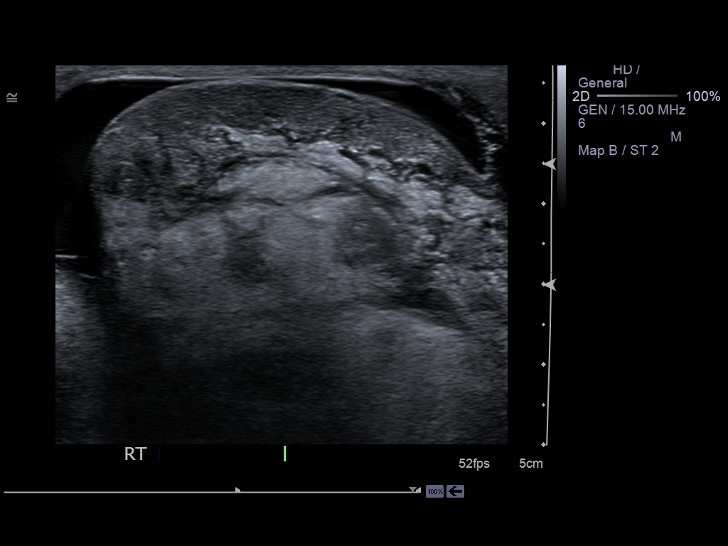
[im 22/48]
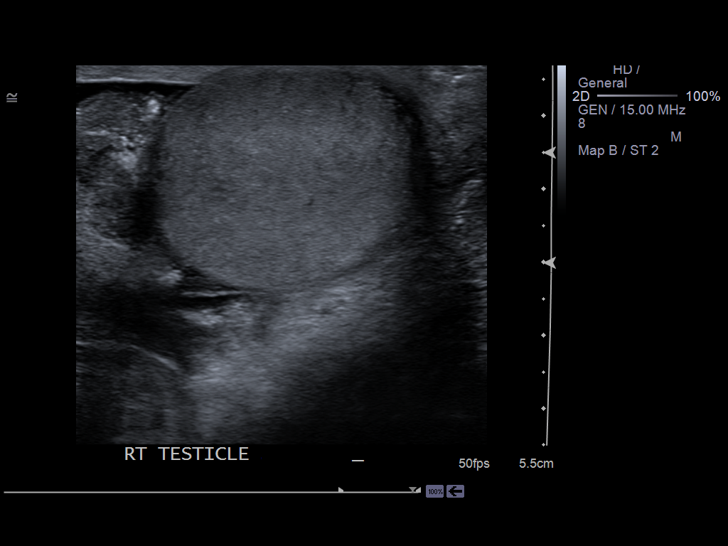
[im 26/48]
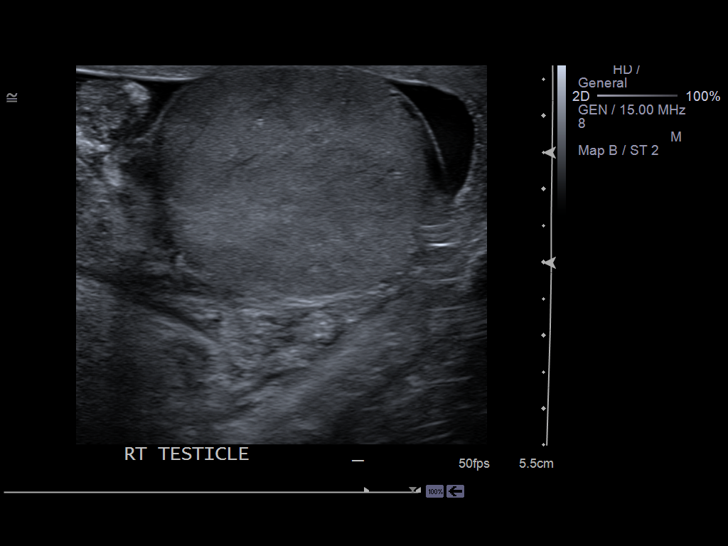
[im 30/48]
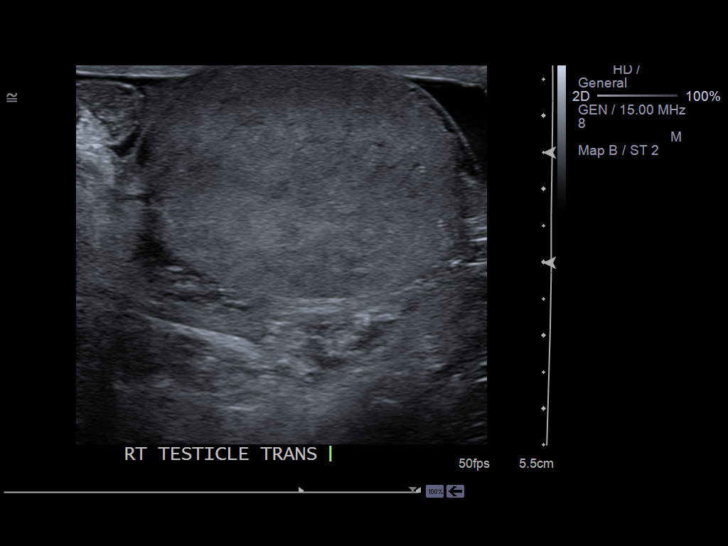
[im 32/48]
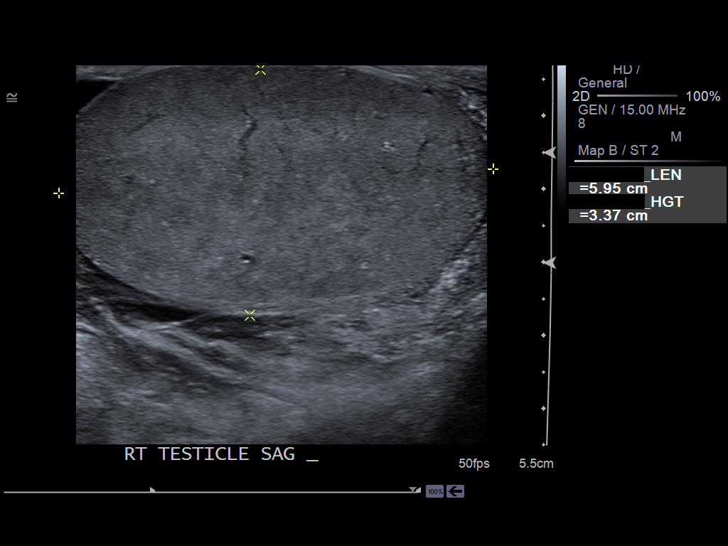
[im 36/48]
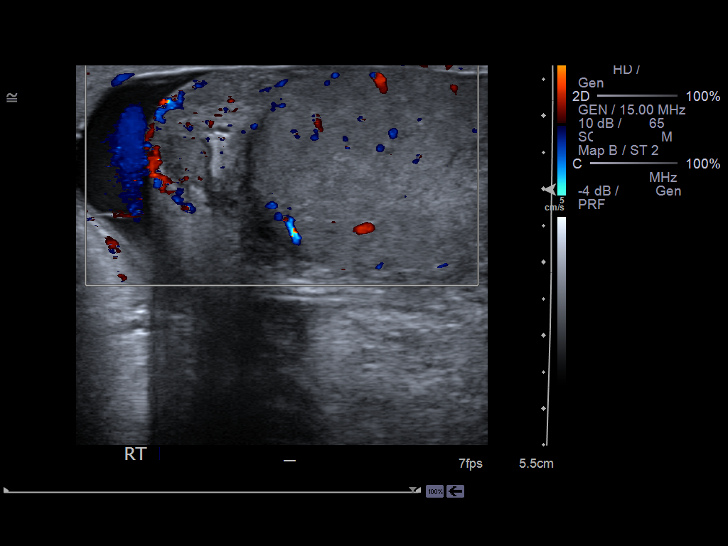
[im 40/48]
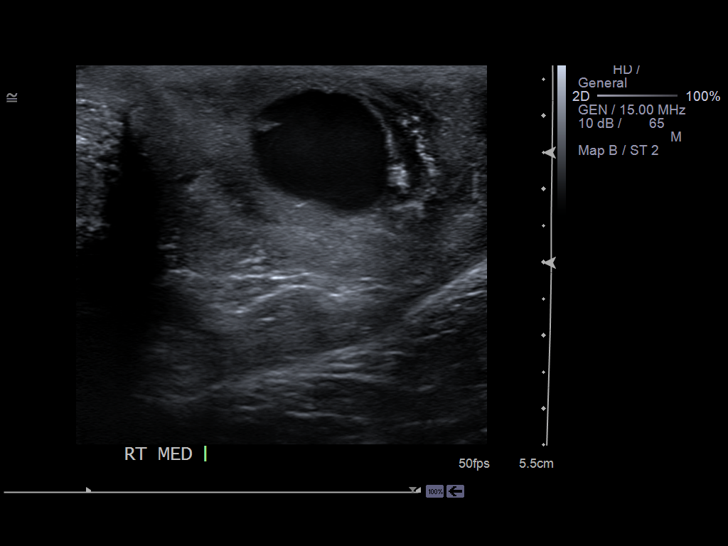
[im 44/48]
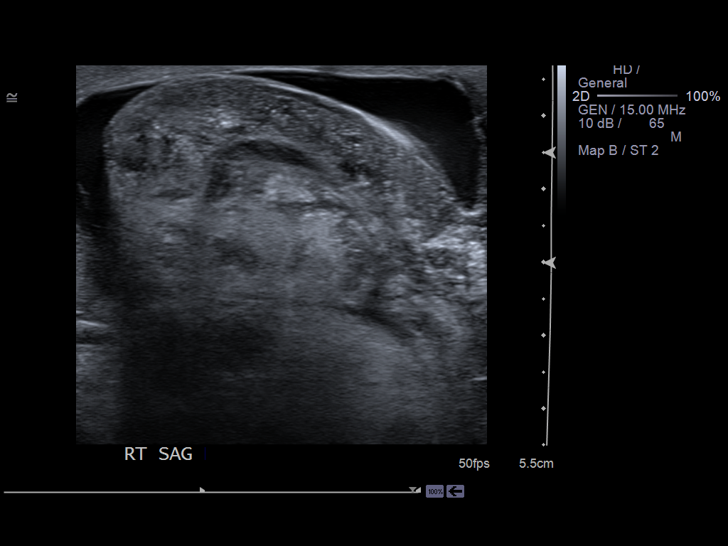
[im 48/48]
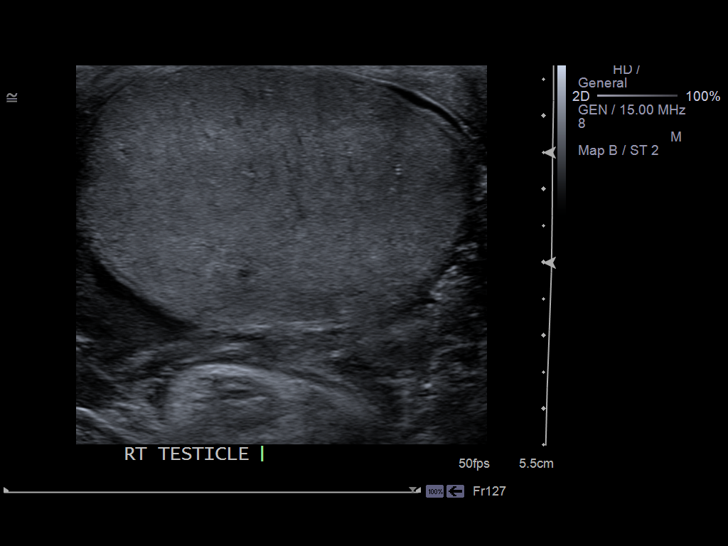

[14 of 25 positions shown; findings below may reference images not displayed]

PROCEDURE:     US  - US TESTICULAR  - March 19, 2012 [DATE]

RESULT:     A testicular ultrasound was performed. The patient has undergone
previous left orchiectomy. The right testicle measures 6 x 3.4 x 4.2 cm and
its echotexture is normal. Vascularity of the testicle appears normal. The
epididymis measures 1.5 x 1.4 x 1.9 cm. Its vascularity also appears normal.
There is a small hydrocele.
IMPRESSION: 1. There is no evidence of testicular torsion nor of an intratesticular mass.
2. The epididymal structures are normal in appearance.
3. There is a small right-sided hydrocele.
4. The left testicle is surgically absent.

[REDACTED]

## 2014-11-17 IMAGING — US US PELVIS LIMITED
1 series · 14 of 25 positions shown · non-contrast
Comparison: none

REASON FOR EXAM: testicle swelling
COMMENTS:

[Series 1: us pelvis limited · 0.13mm/px · 14 of 33 slices shown]
[im 1/33]
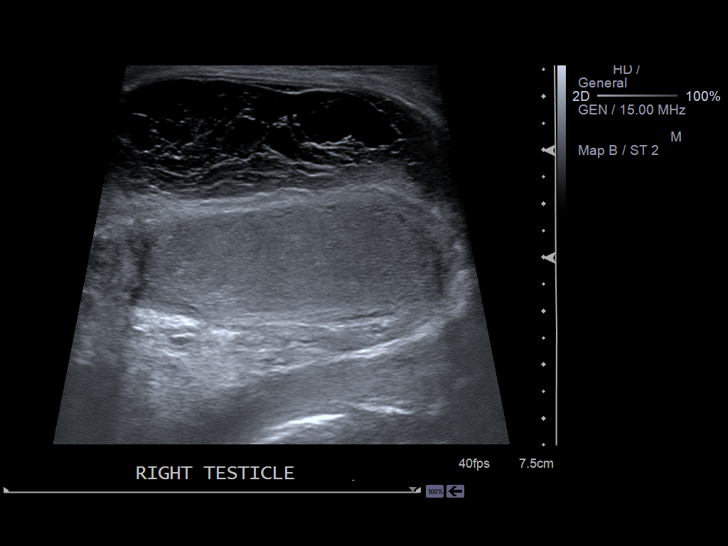
[im 3/33]
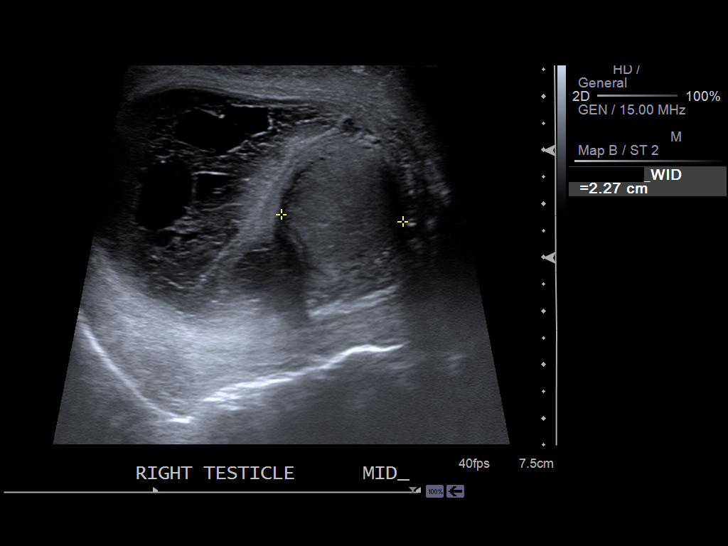
[im 6/33]
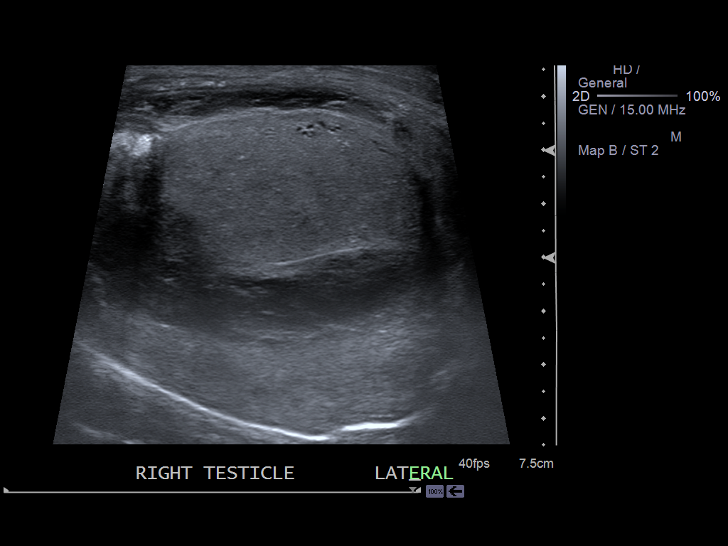
[im 9/33]
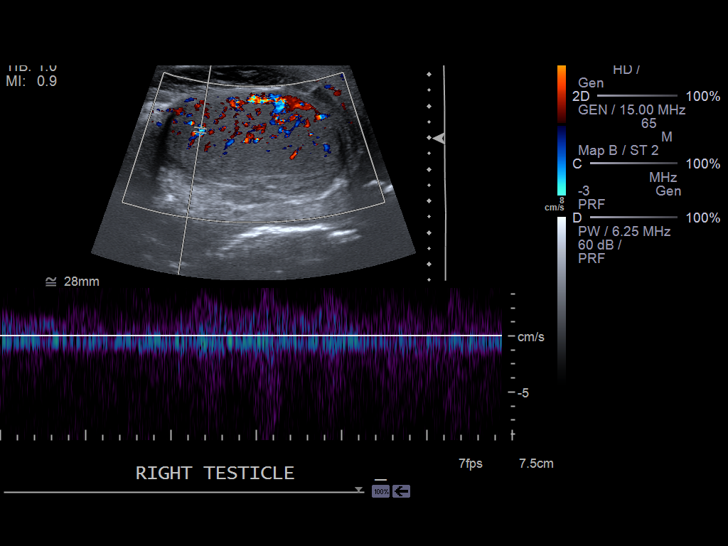
[im 11/33]
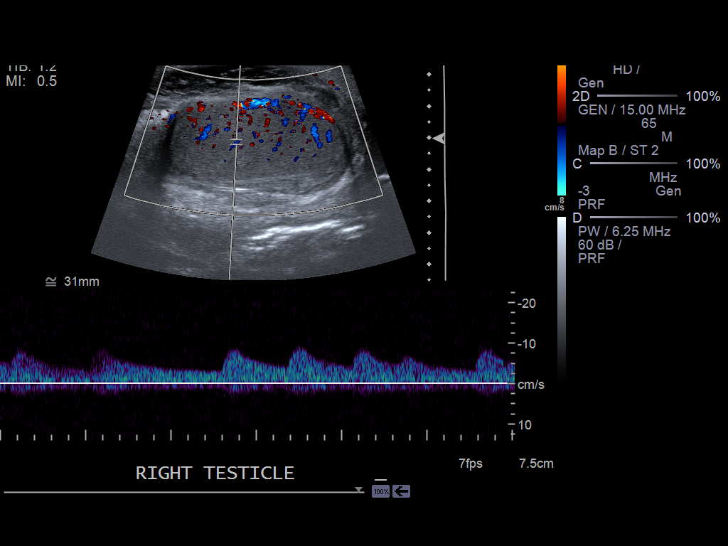
[im 13/33]
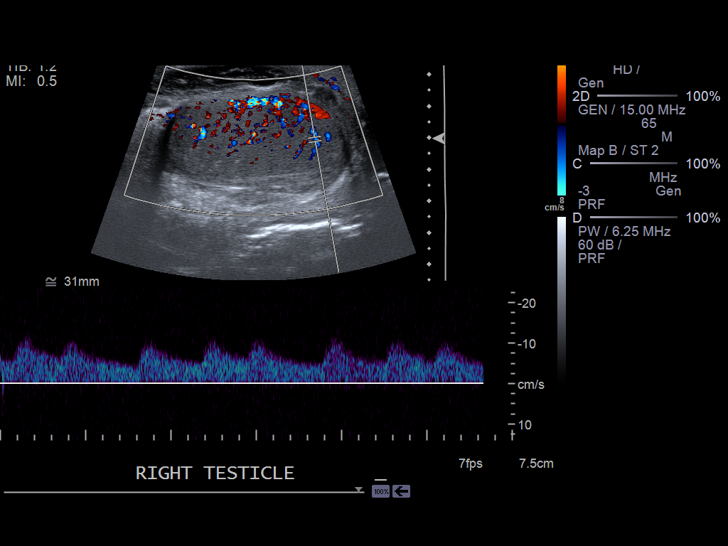
[im 15/33]
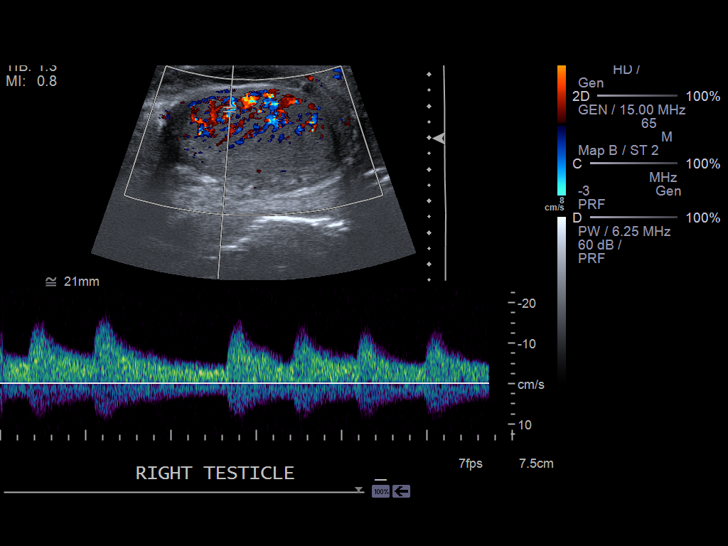
[im 18/33]
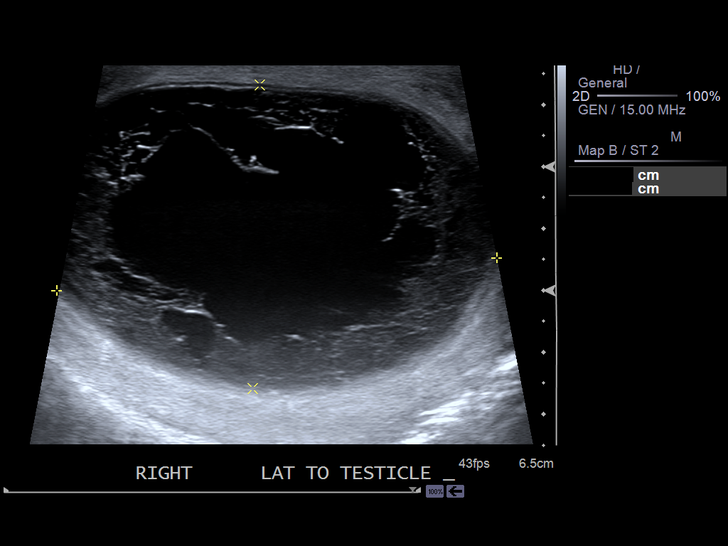
[im 21/33]
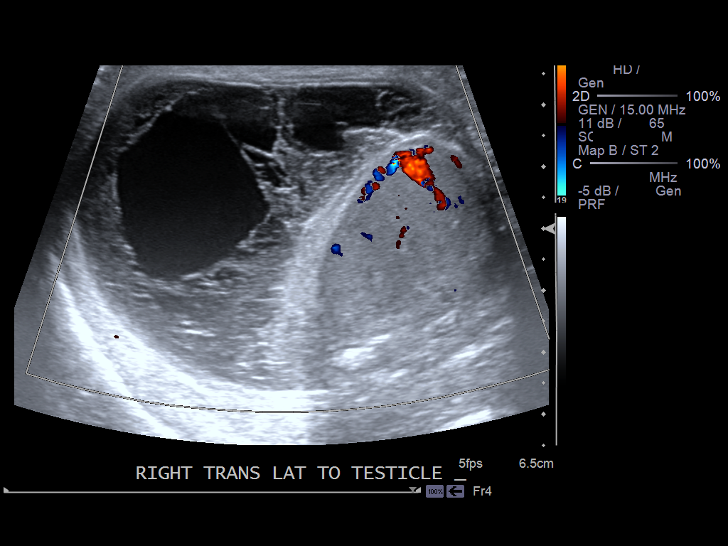
[im 22/33]
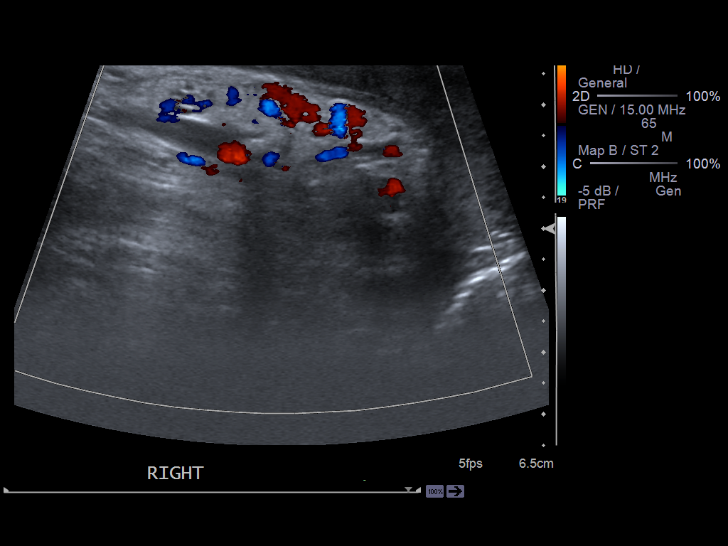
[im 25/33]
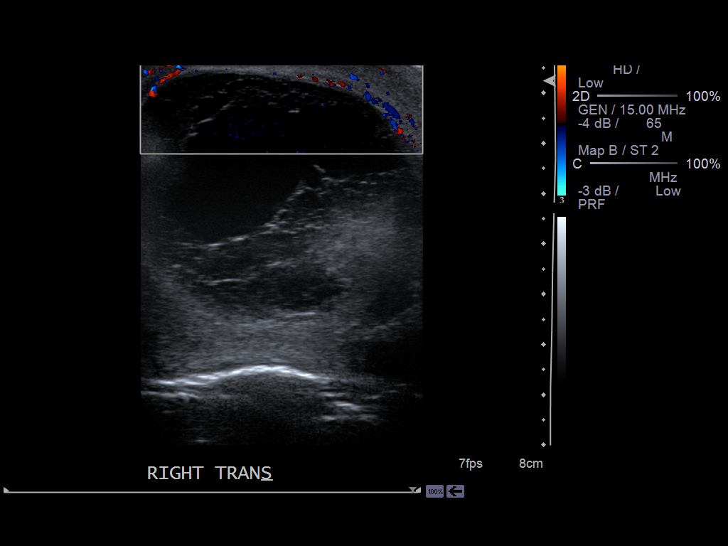
[im 27/33]
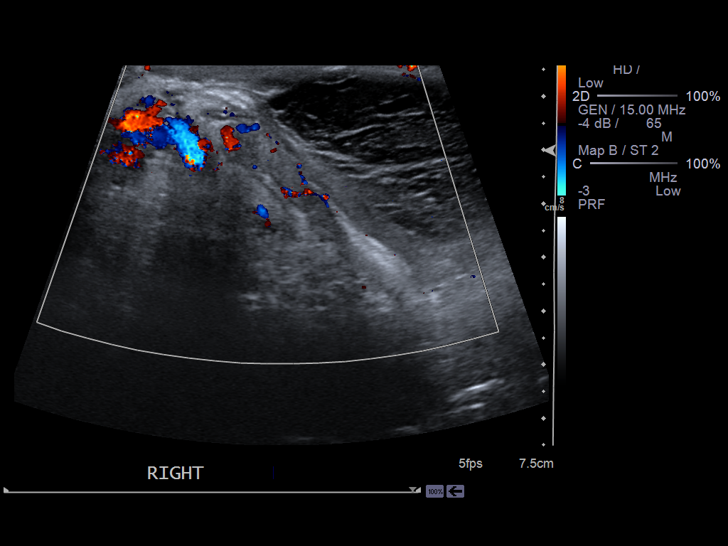
[im 30/33]
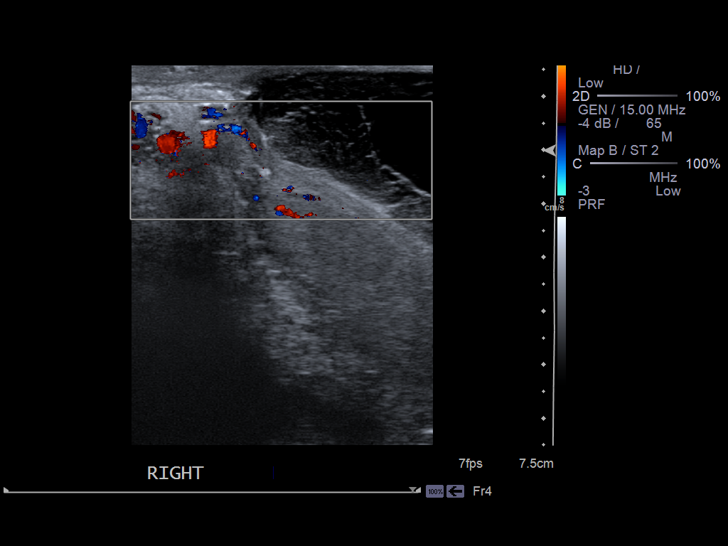
[im 33/33]
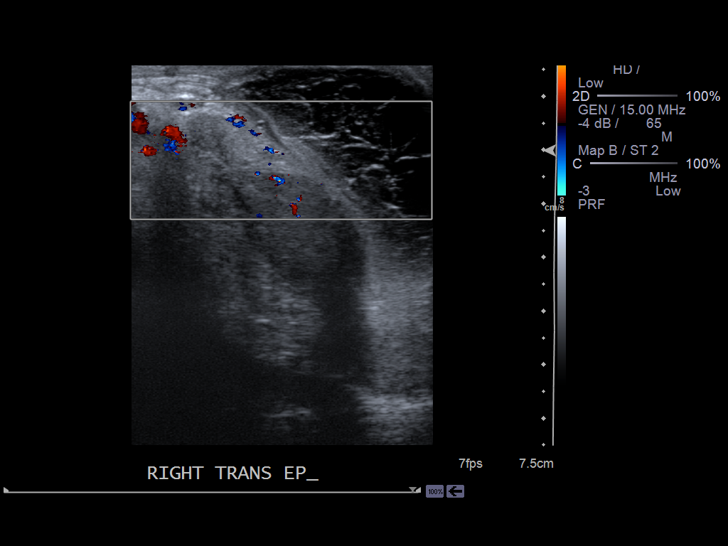

[14 of 25 positions shown; findings below may reference images not displayed]

PROCEDURE:     US  - US TESTICULAR  - April 04, 2012  [DATE]

RESULT:     The patient has a history of left orchiectomy. A testicular
ultrasound 19 March, 2012 is reviewed. At that time a small right-sided
hydrocele was demonstrated. On the current study this has markedly increased
in size and has become complicated with numerous internal echoes. This
measures 7.1 x 4.9 x 4.7 cm.

The resticle exhibits normal echotexture and vascularity and measures 5.1 x
2.1 x 2.3 cm. The epididymis measures 1.4 x 0.8 x 1.5 cm and exhibits normal
vascularity.

The scrotum demonstrates mild wall thickening.
IMPRESSION: 1. Since the previous study the hydrocele has increased in size and internal
echoes have developed. This is of uncertain etiology but given its rapid
onset the possibility of this reflecting hemorrhage is raised.
2. No acute abnormality of the right testicle nor of the epididymis is
demonstrated.

[REDACTED]
